# Patient Record
Sex: Male | Born: 1940 | Race: White | Hispanic: No | Smoking: Never smoker
Health system: Southern US, Community
[De-identification: ages and names within clinical notes are randomized; demographics above are authoritative.]

## PROBLEM LIST (undated history)

## (undated) DIAGNOSIS — I10 Essential (primary) hypertension: Secondary | ICD-10-CM

## (undated) DIAGNOSIS — K648 Other hemorrhoids: Secondary | ICD-10-CM

## (undated) DIAGNOSIS — E785 Hyperlipidemia, unspecified: Secondary | ICD-10-CM

## (undated) DIAGNOSIS — H9319 Tinnitus, unspecified ear: Secondary | ICD-10-CM

## (undated) DIAGNOSIS — J189 Pneumonia, unspecified organism: Secondary | ICD-10-CM

## (undated) DIAGNOSIS — M1990 Unspecified osteoarthritis, unspecified site: Secondary | ICD-10-CM

## (undated) HISTORY — DX: Other hemorrhoids: K64.8

## (undated) HISTORY — DX: Essential (primary) hypertension: I10

## (undated) HISTORY — DX: Hyperlipidemia, unspecified: E78.5

## (undated) HISTORY — PX: TONSILLECTOMY AND ADENOIDECTOMY: SUR1326

## (undated) HISTORY — DX: Pneumonia, unspecified organism: J18.9

---

## 2004-07-25 ENCOUNTER — Ambulatory Visit: Payer: Self-pay | Admitting: Internal Medicine

## 2005-05-08 HISTORY — PX: JOINT REPLACEMENT: SHX530

## 2005-05-24 ENCOUNTER — Encounter: Admission: RE | Admit: 2005-05-24 | Discharge: 2005-05-24 | Payer: Self-pay | Admitting: Specialist

## 2005-09-12 ENCOUNTER — Inpatient Hospital Stay (HOSPITAL_COMMUNITY): Admission: RE | Admit: 2005-09-12 | Discharge: 2005-09-15 | Payer: Self-pay | Admitting: Orthopedic Surgery

## 2008-05-08 DIAGNOSIS — J189 Pneumonia, unspecified organism: Secondary | ICD-10-CM

## 2008-05-08 HISTORY — DX: Pneumonia, unspecified organism: J18.9

## 2008-05-24 ENCOUNTER — Ambulatory Visit: Payer: Self-pay | Admitting: Family Medicine

## 2008-05-24 ENCOUNTER — Inpatient Hospital Stay (HOSPITAL_COMMUNITY): Admission: EM | Admit: 2008-05-24 | Discharge: 2008-05-28 | Payer: Self-pay | Admitting: Emergency Medicine

## 2010-08-22 LAB — CBC
Hemoglobin: 12.8 g/dL — ABNORMAL LOW (ref 13.0–17.0)
Platelets: 273 10*3/uL (ref 150–400)
RBC: 4.02 MIL/uL — ABNORMAL LOW (ref 4.22–5.81)
RBC: 4.1 MIL/uL — ABNORMAL LOW (ref 4.22–5.81)
WBC: 11.3 10*3/uL — ABNORMAL HIGH (ref 4.0–10.5)

## 2010-08-22 LAB — BASIC METABOLIC PANEL
BUN: 20 mg/dL (ref 6–23)
CO2: 23 mEq/L (ref 19–32)
CO2: 25 mEq/L (ref 19–32)
Chloride: 101 mEq/L (ref 96–112)
Chloride: 105 mEq/L (ref 96–112)
Creatinine, Ser: 1.09 mg/dL (ref 0.4–1.5)
Creatinine, Ser: 1.17 mg/dL (ref 0.4–1.5)
GFR calc Af Amer: >60 mL/min (ref >60)
GFR calc Af Amer: >60 mL/min (ref >60)
GFR calc non Af Amer: 54 mL/min — ABNORMAL LOW (ref >60)
GFR calc non Af Amer: >60 mL/min (ref >60)
Glucose, Bld: 115 mg/dL — ABNORMAL HIGH (ref 70–99)
Glucose, Bld: 135 mg/dL — ABNORMAL HIGH (ref 70–99)
Potassium: 3.7 mEq/L (ref 3.5–5.1)
Potassium: 4.1 mEq/L (ref 3.5–5.1)
Potassium: 5.2 mEq/L — ABNORMAL HIGH (ref 3.5–5.1)
Sodium: 133 mEq/L — ABNORMAL LOW (ref 135–145)
Sodium: 139 mEq/L (ref 135–145)

## 2010-08-22 LAB — COMPREHENSIVE METABOLIC PANEL
GFR calc non Af Amer: 43 mL/min — ABNORMAL LOW (ref >60)
Potassium: 4.1 mEq/L (ref 3.5–5.1)
Sodium: 132 mEq/L — ABNORMAL LOW (ref 135–145)
Total Bilirubin: 2.1 mg/dL — ABNORMAL HIGH (ref 0.3–1.2)
Total Protein: 6.8 g/dL (ref 6.0–8.3)

## 2010-08-22 LAB — LEGIONELLA ANTIGEN, URINE: Legionella Antigen, Urine: NEGATIVE

## 2010-09-20 NOTE — H&P (Signed)
NAME:  Steven Paul, Steven Paul NO.:  [id number]   MEDICAL RECORD NO.:  [id number]          Paul TYPE:  INP   LOCATION:  5029                         FACILITY:  [facility]   PHYSICIAN:  Leighton Roach McDiarmid, M.D.DATE OF BIRTH:  1940-06-05   DATE OF ADMISSION:  05/24/2008  DATE OF DISCHARGE:                              HISTORY & PHYSICAL   PRIMARY CARE PHYSICIAN:  Harrel Lemon. Merla Riches, MD, at [facility].   CHIEF COMPLAINT:  Shortness of breath, cough, and fever.   HISTORY OF PRESENT ILLNESS:  Steven Paul is a 70 year old male who  presented to [facility] this morning complaining of pleuritic  chest pain, shortness of breath, cough, and fever to 101.6 for 3-4 days.  Approximately 10 days ago, he was ill with an influenza-like illness,  complaining of symptoms of fever, nasal congestion, cough, sore throat,  and body aches.  This lasted approximately 3 to 4 days and then resolved  completely.  He was able to return to work for 1 week.  He became ill  again 3-4 days ago with a cough, right-sided pleuritic chest pain, and a  feeling that he cannot catch his breath and fever to 101.6.  He did a  course in over-Steven-counter without relief.  He reported to [facility] this morning and was found to have a right lower lobe pneumonia on  chest x-ray and had oxygen saturations of 89-90% on room air, and an  elevated white blood cell count 19.5 with a left shift and thus was sent  for admission to [facility].   PAST MEDICAL HISTORY:  1. Hypertension.  2. Hyperlipidemia.   PAST SURGICAL HISTORY:  Left hip replacement in 2007.   ALLERGIES:  No known drug allergies.   CURRENT MEDICATIONS:  1. Lisinopril/hydrochlorothiazide 20/12.5 p.o. daily.  2. Zocor 40 mg p.o. daily.  3. Aspirin 81 mg p.o. daily.   SOCIAL HISTORY:  Steven Paul lives alone.  He works as Steven Social research officer, government for [employer].  He denies any tobacco use in his  lifetime.  He has a beer approximately once a week and denies any other  drug use.  He is accompanied by his son to Steven emergency room.   FAMILY HISTORY:  Steven Paul denies any known family history of any  medical problems.   REVIEW OF SYSTEMS:  CONSTITUTIONAL:  Complains of fevers, chills,  sweats, decreased appetite, and fatigue.  HEENT:  Denies headaches, sore  throat, ear pain, or rhinorrhea.  CARDIOVASCULAR:  Does complain of  pleuritic chest pain as mentioned above.  Denies edema, orthopnea,  paroxysmal nocturnal dyspnea, and palpitations.  RESPIRATORY:  Complains  of cough, dyspnea as noted above.  Denies wheezing, sputum production,  or hemoptysis.  GI:  Denies nausea and vomiting, any blood in Steven stool,  any abdominal pain.  He does complain of several episodes of diarrhea.  GU:  Denies dysuria, hematuria, nocturia.  SKIN:  Denies rash.  MUSCULOSKELETAL:  Denies myalgias, arthralgias, or swelling.  NEURO:  Denies any visual changes, weakness, numbness, dizziness.  HEME:  Denies  any bleeding or easy bruising.  ENDOCRINE:  Denies polyuria, polydipsia,  heat, and cold intolerance.   PHYSICAL EXAMINATION:  VITAL SIGNS:  Temperature is 99.1, pulse is 77,  respiratory rate is 18, blood pressure 133/81, pulse ox is 95% on 2 L  nasal cannula.  GENERAL:  Steven Paul is pleasant, alert and oriented, in no acute  distress.  HEENT:  Pupils equally round and reactive to light and accommodation.  Extraocular muscles intact.  Sclerae are clear.  Oropharynx is clear  without erythema or exudate.  Steven Paul has moist mucous membranes.  No nasal congestion.  NECK:  Nontender and supple with no lymphadenopathy.  CARDIOVASCULAR:  Regular rate and rhythm.  No murmurs, rubs, or gallops.  LUNGS:  Steven Paul has a normal respiratory effort.  He does have a  pleuritic chest pain on Steven right.  Steven Paul does have crackles in  Steven right lower lobe up to Steven base of Steven right upper lobe.  Steven  left  lung is clear.  ABDOMEN:  Soft, nontender, nondistended.  Normoactive bowel sounds.  No  masses, no hepatosplenomegaly.  EXTREMITIES:  Nontender.  No edema.  A 2+ dorsalis pedis pulses  bilaterally.  NEURO:  Nonfocal.  MUSCULOSKELETAL:  No deformities, no joint swelling, or erythema are  noted.  SKIN:  No rashes or sores.   LABORATORY DATA AND STUDIES:  White blood count of 19.5, hemoglobin of  14.6, hematocrit of 42.9, platelets of 332, absolute neutrophil count of  16.8.  A chest x-ray showed a right lower lobe pneumonia.  These studies  were all performed at [facility] as well as blood cultures were  drawn at [facility].  He was also given 60 mg of Toradol at  [facility].   ASSESSMENT AND PLAN:  This is a 70 year old male with pneumonia  following an influenza-like illness.  1. Pneumonia.  We will treat with vancomycin to cover possible      methicillin-resistant Staphylococcus aureus infection which is more      common after influenza-like illness  and we will use Avelox for      gram negatives and atypicals.  We will continue oxygen for support      to keep O2 saturations greater than 92%.  We will use ibuprofen for      pleuritic chest pain and Percocet if this is not strong enough.  We      will follow up his blood cultures from [facility].  2. Hypertension.  Steven Paul is currently stable off of his      medications.  We will follow and restart as indicated.  We will      obtain a CMET to evaluate Steven Paul's renal function.  3. Hyperlipidemia.  We will continue Steven Paul's home Zocor.  We      will obtain a CMET to evaluate Steven Paul's liver function.  4. Prophylaxis.  We will use Lovenox for deep vein thrombosis      prophylaxis.  We will give Steven Paul Steven PPIs as he will be      taking scheduled ibuprofen.  5. Disposition.  Steven Paul will be stable to discharge home when he      is afebrile and off supplemental  oxygen.      Levander Campion, M.D.  Electronically Signed      Leighton Roach McDiarmid, M.D.  Electronically Signed    JH/MEDQ  D:  05/24/2008  T:  05/25/2008  Job:  [id number]

## 2010-09-20 NOTE — Discharge Summary (Signed)
NAME:  Steven Paul, Steven Paul NO.:  [id number]   MEDICAL RECORD NO.:  [id number]          PATIENT TYPE:  INP   LOCATION:  5029                         FACILITY:  [facility]   PHYSICIAN:  Leighton Roach McDiarmid, M.D.DATE OF BIRTH:  1940/06/25   DATE OF ADMISSION:  05/24/2008  DATE OF DISCHARGE:  05/28/2008                               DISCHARGE SUMMARY   PRIMARY CARE Icey Tello:  Harrel Lemon. Merla Riches, MD   DISCHARGE DIAGNOSES:  1. Post influenza pneumonia.  2. Hypertension.  3. Acute renal failure.  4. Hyperlipidemia.   DISCHARGE MEDICATIONS:  1. Vancomycin 1000 mg IV in normal saline at 250 mL an hour twice a      day.  Stop date June 03, 2008.  2. Avelox 400 mg by mouth daily.  Stop date June 07, 2008.  3. Lisinopril - hydrochlorothiazide 20/12.5 mg 1 tablet by mouth      daily.  4. Zocor 40 mg by mouth daily.  5. Tylenol 650 mg by mouth every 6 hours as needed for pain.  6. Ibuprofen 600 mg by mouth every 6 hours as needed for pain.   CONSULTS:  None.   PROCEDURES:  1. Chest x-ray on May 25, 2008:  Right pleural effusion, bibasilar      airspace disease.  Ovoid opacity projecting of the minor fissure      may represent loculated pleural fluid, however mass cannot be      excluded.  2. CT of the chest on May 25, 2008:  Dense right lower lobe      pneumonia and atelectasis.  Small right pleural effusion including      fluid in the posterior aspect of the minor fissure, corresponding      to the oval density seen on the chest radiographs.  3. Right PICC line placement.   LABORATORY DATAS:  1. CMET on May 24, 2008:  Sodium 132, glucose 155, BUN 34,      creatinine 1.63.  2. CBC on May 25, 2008:  WBC 17.7, hemoglobin 13.2, hematocrit      38.2, platelets 273.  3. Strep pneumo urinary antigen negative.  4. Urine Legionella antigen negative.  5. BNP:  252.  6. Hemoglobin A1c 6.0.  7. CBC on May 27, 2008:  WBCs 11.3, hemoglobin 12.8, hematocrit      38.2, platelets 324.  8. BMET on May 27, 2008:  Sodium 137, potassium 3.7, glucose 115,      BUN 16, creatinine 1.17.   BRIEF HOSPITAL COURSE:  The patient is a 70 year old male with history  of hypertension and hyperlipidemia who presents with a right lower lobe  pneumonia following an influenza-like illness.  1. Right lower lobe pneumonia:  The patient had an influenza-like      illness 10 days prior to pleuritic chest pain and an increased      oxygen requirement.  The patient was seen at [facility] for pleuritic      chest pain and had a pulse ox of 89% in the clinic.  The patient      was transferred to [facility] for further  evaluation.  The patient      had a chest x-ray and a CT scan of the chest for evaluation.  CT      scan showed a dense right lower lobe pneumonia but no signs of      empyema or mass.  The patient was treated for a post influenza      pneumonia with vancomycin and Avelox.  A PICC line was placed on      May 27, 2008 for home IV antibiotic treatment.  On admission,      the patient had a slight oxygen requirement of 2 L by nasal      cannula.  At the time of discharge, the patient was breathing      comfortably on room air, was not tachypneic and was able to      ambulate without becoming short of breath.  The patient was also      tolerating food and liquids without difficulty.  2. Acute renal failure:  The patient came in with a decreased p.o.      intake and had a creatinine of 1.63.  The patient was hydrated      adequately with IV fluids.  With this hydration, the patient's      creatinine appropriately trended down.  Most recent creatinine      before discharge was 1.09.  The patient was able to drink plenty of      fluids and was tolerating food without difficulty.  3. Hypertension.  The patient's blood pressure was acceptable      throughout his hospital stay and it was slightly elevated on the      time of discharge with a systolic blood  pressure between 135 and      160.  The patient's blood pressure medications were initially held      because of his acute renal failure.  The patient's lisinopril and      hydrochlorothiazide were restarted on May 26, 2008.  The      patient's blood pressure remained stable, however, recommend      consider increasing the patient's lisinopril to 40 mg p.o. daily as      an outpatient.  4. Hyperlipidemia.  The patient was continued on his Zocor home dose.   DISCHARGE INSTRUCTIONS:  The patient is to seek medical care for any  increased shortness of breath, difficulty breathing or inability to  tolerate anything by mouth.  The patient is to increase his activity  slowly.  The patient is to have a low-sodium heart-healthy diet.   FOLLOW UP:  The patient is to return to Dr. Merla Riches at [facility], phone number [phone] on June 03, 2008 at 1:45 p.m.   DISCHARGE CONDITION:  The patient was discharged home in stable and  improved condition.      Angelena Sole, MD  Electronically Signed      Leighton Roach McDiarmid, M.D.  Electronically Signed    WS/MEDQ  D:  05/28/2008  T:  05/29/2008  Job:  [id number]

## 2010-09-23 NOTE — H&P (Signed)
NAME:  Steven Paul NO.:  [id number]   MEDICAL RECORD NO.:  [id number]          PATIENT TYPE:  INP   LOCATION:  NA                           FACILITY:  [facility]   PHYSICIAN:  Steven Paul. Steven Paul, M.D.  DATE OF BIRTH:  [birth year]   DATE OF ADMISSION:  09/12/2005  DATE OF DISCHARGE:                                HISTORY & PHYSICAL   CHIEF COMPLAINT:  Pain in my left hip.   PRESENT ILLNESS:  This is a 70 year old white male seen by [place] for continuing  progressive problems concerning pain into his left hip.  He has been seen by  Dr. Elvera Lennox. Valma Cava of our practice with a history of left groin pain  which has now worsened to the point where he really is having difficulty  getting about, unable to perform his day-to-day activities.  He has had  intra-articular injection into the left hip which provided him with some  relief but, unfortunately, it did not last to the point where he was  relieved of his overall discomfort.  After much discussion, including the  risks and benefits of surgery and exhaustion of the conservative measures  which included nonsteroidal anti-inflammatories as well as hip injection, it  was decided to go ahead with total hip replacement arthroplasty.  Dr. Charlann Paul  has discussed the risks and benefits with the patient concerning that  procedure.  He has been cleared preoperatively by Dr. Nichola Sizer of the  [facility], his regular physician.   PAST MEDICAL HISTORY:  The patient has had no previous surgeries.  He is  being been treated now for dyslipidemia and hypertension.   CURRENT MEDICATIONS:  Of Lipitor daily and a new blood pressure medication  which will be available when the patient arrives at the hospital for this  surgical procedure.  Name currently is unknown.   ALLERGIES:  No medical allergies.   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   FAMILY HISTORY:   Noncontributory.   REVIEW OF SYSTEMS:  CNS:  No seizures or paralysis, numbness, double vision.  RESPIRATORY:  No productive cough, no hemoptysis or shortness of breath.  CARDIOVASCULAR:  No chest pain, no angina or orthopnea.  GASTROINTESTINAL:  No nausea, vomiting, melena, or bloody stool.  GENITOURINARY:  No discharge,  dysuria, or hematuria.  MUSCULOSKELETAL:  Primarily in present illness.   PHYSICAL EXAMINATION:  GENERAL:  Alert and cooperative, friendly 70 year old  white male who walks with an obvious limp and a lurch the left.  VITAL SIGNS:  Blood pressure 160/88, pulse 78 and regular, respirations 12  unlabored.  HEENT:  Normocephalic, PERRLA, EOM intact.  Oropharynx is clear.  CHEST:  Clear to auscultation.  No rhonchi or rales.  HEART:  Regular rate and rhythm.  No murmurs are heard.  ABDOMEN:  Soft, nontender, liver and spleen not felt.  GENITALIA/RECTAL:  Not done, not pertinent to present illness.  EXTREMITIES:  The patient has no neuro deficits in the lower extremities.  He has limited range of motion of the  left hip with discomfort on the end  parameters.   ADMISSION DIAGNOSES:  1.  Left hip osteoarthritis.  2.  Hypertension.  3.  Dyslipidemia.   PLAN:  The patient will be admitted for total hip replacement arthroplasty.  In all probability, he will be able to go home with home health.  He has a  walker, and I told him to bring this to the hospital so that he can have  this fitted to his body frame.  He has family support, and I recommend that  a family member stay with him overnight as well as during the day for 3-4  days after his return home until he is confident and safe with ambulation.      Dooley L. Cherlynn June.      Steven Paul Steven Paul, M.D.  Electronically Signed    DLU/MEDQ  D:  09/07/2005  T:  09/07/2005  Job:  [id number]   cc:   Harrel Lemon. Merla Riches, M.D.  Fax: [phone]

## 2010-09-23 NOTE — Op Note (Signed)
NAME:  Steven Paul, Steven Paul NO.:  [id number]   MEDICAL RECORD NO.:  [id number]          PATIENT TYPE:  INP   LOCATION:  0001                         FACILITY:  [facility]   PHYSICIAN:  Madlyn Frankel. Charlann Boxer, M.D.  DATE OF BIRTH:  10/03/1940   DATE OF PROCEDURE:  09/12/2005  DATE OF DISCHARGE:                                 OPERATIVE REPORT   PREOPERATIVE DIAGNOSIS:  Left hip osteoarthritis.   POSTOPERATIVE DIAGNOSIS:  Left hip osteoarthritis.   PROCEDURE:  Left total hip replacement.   COMPONENTS USED:  DePuy hip system with a size 54 ASR cup, Tri-Lock 11.3  lateralized offset stem with a 47 ASR ball with a +2 adapter.   SURGEON:  Madlyn Frankel. Charlann Boxer, M.D.   ASSISTANT:  Cherly Beach, P.A.   ANESTHESIA:  General.   BLOOD LOSS:  300 mL.   DRAINS:  Drains x1.   COMPLICATIONS:  None.   INDICATION FOR PROCEDURE:  Mr. Steven Paul is a 70 year old gentleman who  presented to the office for evaluation of his left hip.  He was kindly sent  over for evaluation from Dr. Thomasena Edis.  Cortisone injection had been  performed; he had had no significant longstanding relief.  Given the failure  to respond to conservative measures, he wished to proceed with hip  replacement surgery.  Risks and benefits were discussed including bearing  surfaces with hip dislocation, DVT, infection, primary concern and other  concerns in terms of persistent discomfort postoperatively, need for  revision later, as well as the bearing surface discussion.  At this point,  we discussed bearing in terms of metal-on-metal, ceramic-on-ceramic versus  the polyethylenes.  He chose the hard-bearing surface.  He furthermore was  interested in the availability of the larger ball size which decreases wear  in the labs as well as increases the jump, reducing the risk of dislocation.  All this was reviewed and he chose to have an ASR hip technology, provided  his bone would tolerate it.  Consent was obtained.   PROCEDURE  IN DETAIL:  The patient was brought to the operative theatre.  Once adequate anesthesia and preoperative antibiotics, 1 g of Ancef, were  administered, the patient was positioned in the right lateral decubitus  position with the left side up.  The left lower extremity was then prepped  and draped in a sterile fashion.  A lateral-based incision was made for  posterior approach to the hip.  Short external rotators were taken down  separate from the posterior capsule, which was saved as a leaflet to repair  against the superior leaflet.   The hip was dislocated.  At this point, a neck osteotomy was made, based  with using the templated guide, based on the center of the femoral head.  The patient's preoperative leg lengths were equal and our idea was to  maintain his current position.  Neck osteotomy was made based on his  anatomic landmarks and his preoperative templating.  Following this,  attention was directed to the femoral preparation.  Femur exposure was  obtained, starting reamer passed by hand followed by broaching.  Broaching  was carried up all the way up to an 11.3 with excellent purchase.  For this  reason, attention was now directed to the acetabulum.  Please note that I  had about 20-25 degrees of anteversion in the femoral neck with this broach  in place.   Acetabular exposure was obtained routinely and it was note to have a very  hypertrophic, enlarged labrum, which was excised.  Following acetabular  exposure, reaming commenced with a 47 reamer, medialized and then  sequentially reamed up using mini curved reamers to a 53 reamer.  This had  excellent bone preparation with good bony coverage.  The final 54 ASL cup  was then impacted with a curved reamer to allow for correct orientation in  terms of forward flexion.  This was impacted to a good style of fit at the  level where the reamers were positioned.   The cup was positioned at 35-40 degrees of abduction and 20 degrees  of  forward flexion.  It was beneath the anterior ream anteriorly and level with  the ischium posteriorly.   Following this, attention was redirected the femur, where the trial broach  was positioned and trial reduction was carried out.  I was very happy with  the leg lengths, as I had compared the operative leg to the down leg  preoperatively based on his supine position and radiographic appearance.   His leg lengths appeared to be very equal and the hip was extremely stable  with no signs of subluxation.  Given this, the final components were brought  to the field, holding all but the sleeve for the ASL ball.  The final 11.3  lateralized offset stem was then impacted to the level of the neck cut.  With this, a trial reduction was again carried out and I was happy with this  location with stability and leg lengths.  The final +2 adapter was impacted  into the 47 ball and this complex then impacted onto a clean and dry  trunnion.  The hip was reduced.  The hip was irrigated throughout the case.  There were no obvious complications.  The medium Hemovac drain was placed  deep.  The posterior capsular leaflet was reapproximated to the superior  leaflet using #1 Ethibond.  The remainder of the iliotibial band was  reapproximated using a #1 Ethibond and the gluteal fascia was reapproximated  with a #1 Vicryl.  The wound was closed in layers with a 4-0 Monocryl.   The patient was brought to recovery room with a sterile dry dressing and  Steri-Strips.  The patient tolerated the procedure well without  complications.      Madlyn Frankel Charlann Boxer, M.D.  Electronically Signed     MDO/MEDQ  D:  09/12/2005  T:  09/13/2005  Job:  [id number]

## 2010-09-23 NOTE — Discharge Summary (Signed)
NAME:  Steven Paul, VIZZINI NO.:  [id number]   MEDICAL RECORD NO.:  [id number]          PATIENT TYPE:  INP   LOCATION:  1516                         FACILITY:  [facility]   PHYSICIAN:  Madlyn Frankel. Charlann Boxer, M.D.  DATE OF BIRTH:  1940-05-18   DATE OF ADMISSION:  09/12/2005  DATE OF DISCHARGE:  09/15/2005                                 DISCHARGE SUMMARY   ADMITTING DIAGNOSES:  1.  Left hip osteoarthritis.  2.  Hypertension.  3.  Dyslipidemia.   DISCHARGE DIAGNOSES:  1.  Left hip osteoarthritis.  2.  Hypertension.  3.  Dyslipidemia.  4.  Mild postoperative anemia.   OPERATION:  On Sep 12, 2005, the patient underwent left total hip replacement  arthroplasty utilizing DePuy hip system.  [facility] assisted.   BRIEF HISTORY:  This 70 year old gentleman with progressive problems  concerning pain into his left hip.  He works at [employer] and is on his feet a considerable amount of time.  He now finds  that his day-to-day activity is markedly being interfered with his  progression of pain into the left hip.  He has had intraarticular cortisone  injections of the hip which have provided him with minimal to no relief.  After much discussion including the risks and benefits of surgery, it was  decided to go ahead with total hip replacement arthroplasty and the patient  was admitted for same.   COURSE IN THE HOSPITAL:  The patient tolerated the surgical procedure quite  well.  He entered eagerly into the rehabilitation program on the hospital  floor.  The wound remained dry, Hemovac was discontinued with no problem,  the patient was able to void and positive flatus, and was ambulating in the  hall prior to discharge.   Once the home equipment was delivered as well as home health arranged, it  was felt the patient could be maintained in his home environment and he was  discharged home.   On the day of discharge his left hip wound was clean and dry,  neurovascular  was intact to the left lower extremity.  He did run a mild low-grade  temperature while in the hospital but incentive spirometer was used and he  was normal at discharge.  Laboratory values in the hospital hematologically  showed a CBC with differential preoperatively completely within normal  limits.  Hemoglobin was 14.1 with hematocrit of 41.4.  Final hemoglobin was  10.6 with hematocrit of 31.1.  Blood chemistries were normal and urinalysis  negative for urinary tract infection.  Chest x-ray showed low inspiratory  lung volumes with no active disease.  Electrocardiogram was normal sinus  rhythm, moderate voltage criteria for LVH, may be normal variant.   CONDITION ON DISCHARGE:  Improved, stable.   PLAN:  The patient is discharged to his home with home health.  Return to  see Dr. Charlann Boxer 2 weeks after the date of surgery.  He is to continue with 50%  weightbearing for at least 6 weeks after the date of surgery.  We will  continue  Coumadin protocol for 4  weeks after the date of surgery to be adjusted by  home health pharmacy.  Continue with home medications and diet, avoid green  vegetables.  Call with any problems.  He is to follow up with his medical  doctor per his instructions.      Dooley L. Cherlynn June.      Madlyn Frankel Charlann Boxer, M.D.  Electronically Signed    DLU/MEDQ  D:  09/15/2005  T:  09/16/2005  Job:  [id number]   cc:   Harrel Lemon. Merla Riches, M.D.  Fax: [phone]

## 2011-08-02 ENCOUNTER — Ambulatory Visit (INDEPENDENT_AMBULATORY_CARE_PROVIDER_SITE_OTHER): Payer: PRIVATE HEALTH INSURANCE | Admitting: Internal Medicine

## 2011-08-02 ENCOUNTER — Encounter: Payer: Self-pay | Admitting: Internal Medicine

## 2011-08-02 VITALS — BP 147/96 | HR 67 | Temp 97.0°F | Resp 16 | Ht 66.0 in | Wt 192.6 lb

## 2011-08-02 DIAGNOSIS — R739 Hyperglycemia, unspecified: Secondary | ICD-10-CM

## 2011-08-02 DIAGNOSIS — I10 Essential (primary) hypertension: Secondary | ICD-10-CM | POA: Insufficient documentation

## 2011-08-02 DIAGNOSIS — R7309 Other abnormal glucose: Secondary | ICD-10-CM

## 2011-08-02 DIAGNOSIS — Z0000 Encounter for general adult medical examination without abnormal findings: Secondary | ICD-10-CM

## 2011-08-02 DIAGNOSIS — E785 Hyperlipidemia, unspecified: Secondary | ICD-10-CM | POA: Insufficient documentation

## 2011-08-02 LAB — POCT URINALYSIS DIPSTICK
Glucose, UA: NEGATIVE
Nitrite, UA: NEGATIVE
pH, UA: 5.5

## 2011-08-02 LAB — CBC WITH DIFFERENTIAL/PLATELET
Basophils Absolute: 0 10*3/uL (ref 0.0–0.1)
HCT: 44.5 % (ref 39.0–52.0)
Lymphs Abs: 2.1 10*3/uL (ref 0.7–4.0)
MCHC: 33.7 g/dL (ref 30.0–36.0)
Monocytes Absolute: 1.6 10*3/uL — ABNORMAL HIGH (ref 0.1–1.0)
Neutrophils Relative %: 61 % (ref 43–77)
RBC: 4.8 MIL/uL (ref 4.22–5.81)
RDW: 14.1 % (ref 11.5–15.5)

## 2011-08-02 LAB — LIPID PANEL
Cholesterol: 215 mg/dL — ABNORMAL HIGH (ref 0–200)
LDL Cholesterol: 126 mg/dL — ABNORMAL HIGH (ref 0–99)
VLDL: 45 mg/dL — ABNORMAL HIGH (ref 0–40)

## 2011-08-02 LAB — COMPREHENSIVE METABOLIC PANEL
AST: 18 U/L (ref 0–37)
Calcium: 10 mg/dL (ref 8.4–10.5)
Glucose, Bld: 105 mg/dL — ABNORMAL HIGH (ref 70–99)
Potassium: 4.4 mEq/L (ref 3.5–5.3)
Total Protein: 6.9 g/dL (ref 6.0–8.3)

## 2011-08-02 LAB — PSA: PSA: 0.51 ng/mL (ref <=4.00)

## 2011-08-02 MED ORDER — LISINOPRIL-HYDROCHLOROTHIAZIDE 20-12.5 MG PO TABS
1.0000 | ORAL_TABLET | Freq: Every day | ORAL | Status: DC
Start: 2011-08-02 — End: 2012-01-25

## 2011-08-02 MED ORDER — SIMVASTATIN 20 MG PO TABS: 20.0000 mg | ORAL_TABLET | Freq: Every evening | ORAL | Status: DC

## 2011-08-02 NOTE — Progress Notes (Signed)
  Subjective:    Patient ID: Steven Paul, male    DOB: May 25, 1940, 71 y.o.   MRN: [id number]  HPI Patient Active Problem List  Diagnoses  . HTN (hypertension)  . Hyperlipemia  . 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   HENT: Negative.   Eyes: Negative.   Respiratory: Negative.   Cardiovascular: Negative.   Gastrointestinal: Negative.   Genitourinary: Negative.   Musculoskeletal: Negative.   Skin: Negative.   Neurological: Negative.   Hematological: Negative.   Psychiatric/Behavioral: Negative.        Objective:   Physical Exam  Constitutional: He is oriented to person, place, and time. He appears well-developed and well-nourished.  HENT:  Head: Normocephalic.  Nose: Nose normal.  Mouth/Throat: Oropharynx is clear and moist.       TMs and canals clear  Eyes: Conjunctivae and EOM are normal. Pupils are equal, round, and reactive to light.  Neck: Normal range of motion. Neck supple. No thyromegaly present.  Cardiovascular: Normal rate, regular rhythm, normal heart sounds and intact distal pulses.        No carotid bruits  Pulmonary/Chest: Effort normal and breath sounds normal.  Abdominal: Soft. Bowel sounds are normal. He exhibits no mass.       No organomegaly  Genitourinary:       Recent colonoscopy/last prostate exam within normal limits with PSA less than 1  Musculoskeletal: Normal range of motion. He exhibits no tenderness.  Neurological: He is alert and oriented to person, place, and time. He has normal reflexes.  Skin: No rash noted.  Psychiatric: He has a normal mood and affect. His behavior is normal. Judgment and thought content normal.          Assessment & Plan:  Problem #1 annual physical exam Zostavax PSA  #2 hypertension Meds refilled  Problem #3  hyperlipidemia Meds refilled/labs checked  Problem #4 hemoglobin A1c 6.1 Repeated Urged weight loss 20 pounds/discussed increase exercise and decrease calories  Followup 6 months

## 2011-08-03 LAB — HEMOGLOBIN A1C: Mean Plasma Glucose: 131 mg/dL — ABNORMAL HIGH (ref <117)

## 2011-08-08 ENCOUNTER — Encounter: Payer: Self-pay | Admitting: Internal Medicine

## 2011-08-11 ENCOUNTER — Other Ambulatory Visit: Payer: Self-pay | Admitting: Internal Medicine

## 2011-11-23 ENCOUNTER — Encounter: Payer: Self-pay | Admitting: Family Medicine

## 2012-01-01 ENCOUNTER — Encounter: Payer: Self-pay | Admitting: Internal Medicine

## 2012-01-25 ENCOUNTER — Encounter (HOSPITAL_COMMUNITY): Payer: Self-pay | Admitting: Pharmacy Technician

## 2012-01-31 ENCOUNTER — Ambulatory Visit (HOSPITAL_COMMUNITY)
Admission: RE | Admit: 2012-01-31 | Discharge: 2012-01-31 | Disposition: A | Discharge status: Home / Self Care | Payer: PRIVATE HEALTH INSURANCE | Source: Ambulatory Visit | Attending: Orthopedic Surgery | Admitting: Orthopedic Surgery

## 2012-01-31 ENCOUNTER — Ambulatory Visit: Payer: PRIVATE HEALTH INSURANCE | Admitting: Internal Medicine

## 2012-01-31 ENCOUNTER — Encounter (HOSPITAL_COMMUNITY)
Admission: RE | Admit: 2012-01-31 | Discharge: 2012-01-31 | Disposition: A | Discharge status: Home / Self Care | Payer: PRIVATE HEALTH INSURANCE | Source: Ambulatory Visit | Attending: Orthopedic Surgery | Admitting: Orthopedic Surgery

## 2012-01-31 ENCOUNTER — Encounter (HOSPITAL_COMMUNITY): Payer: Self-pay

## 2012-01-31 DIAGNOSIS — I517 Cardiomegaly: Secondary | ICD-10-CM | POA: Insufficient documentation

## 2012-01-31 DIAGNOSIS — Z01810 Encounter for preprocedural cardiovascular examination: Secondary | ICD-10-CM | POA: Insufficient documentation

## 2012-01-31 DIAGNOSIS — Z01812 Encounter for preprocedural laboratory examination: Secondary | ICD-10-CM | POA: Insufficient documentation

## 2012-01-31 DIAGNOSIS — Z01818 Encounter for other preprocedural examination: Secondary | ICD-10-CM | POA: Insufficient documentation

## 2012-01-31 HISTORY — DX: Tinnitus, unspecified ear: H93.19

## 2012-01-31 LAB — CBC
MCH: 31.4 pg (ref 26.0–34.0)
Platelets: 257 10*3/uL (ref 150–400)
RBC: 4.77 MIL/uL (ref 4.22–5.81)
RDW: 13.7 % (ref 11.5–15.5)
WBC: 7.4 10*3/uL (ref 4.0–10.5)

## 2012-01-31 LAB — URINALYSIS, ROUTINE W REFLEX MICROSCOPIC
Glucose, UA: NEGATIVE mg/dL
Leukocytes, UA: NEGATIVE
Specific Gravity, Urine: 1.012 (ref 1.005–1.030)
pH: 5.5 (ref 5.0–8.0)

## 2012-01-31 LAB — BASIC METABOLIC PANEL
Calcium: 10.3 mg/dL (ref 8.4–10.5)
Creatinine, Ser: 1.23 mg/dL (ref 0.50–1.35)
GFR calc non Af Amer: 58 mL/min — ABNORMAL LOW (ref >90)
Sodium: 139 mEq/L (ref 135–145)

## 2012-01-31 LAB — SURGICAL PCR SCREEN
MRSA, PCR: NEGATIVE
Staphylococcus aureus: NEGATIVE

## 2012-01-31 LAB — APTT: aPTT: 34 seconds (ref 24–37)

## 2012-01-31 LAB — PROTIME-INR: Prothrombin Time: 12.9 seconds (ref 11.6–15.2)

## 2012-01-31 NOTE — Patient Instructions (Addendum)
20 EEAN BUSS  01/31/2012   Your procedure is scheduled on:  02-06-2012  Report to [facility] at 1130  AM.  Call this number if you have problems the morning of surgery: [phone]   Remember:   Do not eat food:After Midnight.   clear liquids midnight until 0800 am day of surgery, then nothing by mouth.  Take these medicines the morning of surgery with A SIP OF WATER: no meds to take   Do not wear jewelry or make up.  Do not wear lotions, powders, or perfumes. You may wear deodorant.    Do not bring valuables to the hospital.  Contacts, dentures or bridgework may not be worn into surgery.  Leave suitcase in the car. After surgery it may be brought to your room.  For patients admitted to the hospital, checkout time is 11:00 AM the day of discharge                             Patients discharged the day of surgery will not be allowed to drive home. If going home same day of surgery, you must have someone stay with you the first 24 hours at home and arrange for some one to drive you home from hospital.    Special Instructions: See [facility] Preparing for Surgery instruction sheet. Women do not shave legs or underarms for 12 hours before showers. Men may shave face morning of surgery.    Please read over the following fact sheets that you were given: MRSA Information, blood fact sheet, incentive spirometer fact sheet, clear liquids sheet  Cain S[facility] pre op nurse phone number [phone], call if needed

## 2012-01-31 NOTE — Progress Notes (Signed)
Medical clearance note dr Merla Riches on chart

## 2012-02-01 NOTE — H&P (Signed)
Steven Paul is an 71 y.o. male.    Chief Complaint:   Right hip OA and pain   HPI: Pt is a 71 y.o. male complaining of significant right hip pain for 6-7 months. Pain had continually increased since the beginning. X-rays in the clinic show end-stage arthritic changes of the right hip. Pt has tried various conservative treatments which have failed to alleviate their symptoms. Various options are discussed with the patient. Risks, benefits and expectations were discussed with the patient. Patient understand the risks, benefits and expectations and wishes to proceed with surgery.   PCP:  Tonye Pearson, MD  D/C Plans:  Home with HHPT  Post-op Meds:   Rx given for ASA, Robaxin, Iron, Colace and MiraLax  Tranexamic Acid:   To be given  Decadron:   To be given  PMH: Past Medical History  Diagnosis Date  . Hypertension   . Hyperlipidemia   . Internal hemorrhoids   . Pneumonia 05/2008  . Tinnitus     both ears all the time    PSH: Past Surgical History  Procedure Date  . Tonsillectomy and adenoidectomy as child  . Joint replacement 2007    L Hip- Dupay    Social History:  reports that he has never smoked. He has never used smokeless tobacco. He reports that he does not drink alcohol or use illicit drugs.  Allergies:  No Known Allergies  Medications: No current facility-administered medications for this encounter.   Current Outpatient Prescriptions  Medication Sig Dispense Refill  . lisinopril-hydrochlorothiazide (PRINZIDE,ZESTORETIC) 20-12.5 MG per tablet Take 1 tablet by mouth daily with breakfast.      . simvastatin (ZOCOR) 20 MG tablet Take 20 mg by mouth every evening.        Results for orders placed during the hospital encounter of 01/31/12 (from the past 48 hour(s))  URINALYSIS, ROUTINE W REFLEX MICROSCOPIC     Status: Normal   Collection Time   01/31/12  1:15 PM      Component Value Range Comment   Color, Urine YELLOW  YELLOW    APPearance CLEAR   CLEAR    Specific Gravity, Urine 1.012  1.005 - 1.030    pH 5.5  5.0 - 8.0    Glucose, UA NEGATIVE  NEGATIVE mg/dL    Hgb urine dipstick NEGATIVE  NEGATIVE    Bilirubin Urine NEGATIVE  NEGATIVE    Ketones, ur NEGATIVE  NEGATIVE mg/dL    Protein, ur NEGATIVE  NEGATIVE mg/dL    Urobilinogen, UA 0.2  0.0 - 1.0 mg/dL    Nitrite NEGATIVE  NEGATIVE    Leukocytes, UA NEGATIVE  NEGATIVE   SURGICAL PCR SCREEN     Status: Normal   Collection Time   01/31/12  1:15 PM      Component Value Range Comment   MRSA, PCR NEGATIVE  NEGATIVE    Staphylococcus aureus NEGATIVE  NEGATIVE   APTT     Status: Normal   Collection Time   01/31/12  2:00 PM      Component Value Range Comment   aPTT 34  24 - 37 seconds   BASIC METABOLIC PANEL     Status: Abnormal   Collection Time   01/31/12  2:00 PM      Component Value Range Comment   Sodium 139  135 - 145 mEq/L    Potassium 4.3  3.5 - 5.1 mEq/L    Chloride 102  96 - 112 mEq/L  CO2 26  19 - 32 mEq/L    Glucose, Bld 116 (*) 70 - 99 mg/dL    BUN 19  6 - 23 mg/dL    Creatinine, Ser 1.61  0.50 - 1.35 mg/dL    Calcium 09.6  8.4 - 10.5 mg/dL    GFR calc non Af Amer 58 (*) >90 mL/min    GFR calc Af Amer 67 (*) >90 mL/min   CBC     Status: Normal   Collection Time   01/31/12  2:00 PM      Component Value Range Comment   WBC 7.4  4.0 - 10.5 K/uL    RBC 4.77  4.22 - 5.81 MIL/uL    Hemoglobin 15.0  13.0 - 17.0 g/dL    HCT 04.5  40.9 - 81.1 %    MCV 89.3  78.0 - 100.0 fL    MCH 31.4  26.0 - 34.0 pg    MCHC 35.2  30.0 - 36.0 g/dL    RDW 91.4  78.2 - 95.6 %    Platelets 257  150 - 400 K/uL   PROTIME-INR     Status: Normal   Collection Time   01/31/12  2:00 PM      Component Value Range Comment   Prothrombin Time 12.9  11.6 - 15.2 seconds    INR 0.98  0.00 - 1.49    Dg Chest 2 View  01/31/2012  *RADIOLOGY REPORT*  Clinical Data: Preop radiograph  CHEST - 2 VIEW  Comparison: 05/27/2008  Findings: Tortuous and unfolded thoracic aorta is noted.  There is mild  cardiac enlargement.  No pleural effusion or edema.  No airspace consolidation.  IMPRESSION:  1.  No acute cardiopulmonary abnormalities. 2.  Mild cardiac enlargement.   Original Report Authenticated By: Rosealee Albee, M.D.     ROS: Review of Systems  Constitutional: Negative.   HENT: Negative.   Eyes: Negative.   Respiratory: Negative.   Cardiovascular: Negative.   Gastrointestinal: Negative.   Genitourinary: Negative.   Musculoskeletal: Positive for joint pain.  Skin: Negative.   Neurological: Negative.   Endo/Heme/Allergies: Negative.   Psychiatric/Behavioral: Negative.      Physical Exam: BP:   137/83  ;  HR:   84  ; Resp:   16  ; Physical Exam  Constitutional: He is oriented to person, place, and time and well-developed, well-nourished, and in no distress.  HENT:  Head: Normocephalic and atraumatic.  Nose: Nose normal.  Mouth/Throat: Oropharynx is clear and moist.  Eyes: Pupils are equal, round, and reactive to light.  Neck: Neck supple. No JVD present. No tracheal deviation present. No thyromegaly present.  Cardiovascular: Normal rate, regular rhythm, normal heart sounds and intact distal pulses.   Pulmonary/Chest: Effort normal and breath sounds normal. No stridor. No respiratory distress. He has no wheezes.  Abdominal: Soft. There is no tenderness. There is no guarding.  Musculoskeletal:       Right hip: He exhibits decreased range of motion, decreased strength, tenderness and bony tenderness. He exhibits no swelling, no deformity and no laceration.  Lymphadenopathy:    He has no cervical adenopathy.  Neurological: He is alert and oriented to person, place, and time.  Skin: Skin is warm and dry.  Psychiatric: Affect normal.     Assessment/Plan Assessment:   Right hip OA and pain   Plan: Patient will undergo a right total hip arthroplasty, anterior approach on 02/06/2012 per Dr. Charlann Boxer at [facility]. Risks benefits and expectations  were discussed with  the patient. Patient understand risks, benefits and expectations and wishes to proceed.   Anastasio Auerbach Kameshia Madruga   PAC  02/01/2012, 5:50 PM

## 2012-02-05 NOTE — Progress Notes (Signed)
Pt aware surgery time changed to 1335, arrive 1100 am 02-06-2012 [facility], cleat liquids midnight until 0730am, then nothing by mouth

## 2012-02-06 ENCOUNTER — Inpatient Hospital Stay (HOSPITAL_COMMUNITY): Payer: PRIVATE HEALTH INSURANCE

## 2012-02-06 ENCOUNTER — Inpatient Hospital Stay (HOSPITAL_COMMUNITY): Payer: PRIVATE HEALTH INSURANCE | Admitting: Anesthesiology

## 2012-02-06 ENCOUNTER — Encounter (HOSPITAL_COMMUNITY): Payer: Self-pay | Admitting: *Deleted

## 2012-02-06 ENCOUNTER — Encounter (HOSPITAL_COMMUNITY): Payer: Self-pay | Admitting: Anesthesiology

## 2012-02-06 ENCOUNTER — Encounter (HOSPITAL_COMMUNITY)
Admission: RE | Disposition: A | Discharge status: Home Health | Payer: Self-pay | Source: Ambulatory Visit | Attending: Orthopedic Surgery

## 2012-02-06 ENCOUNTER — Inpatient Hospital Stay (HOSPITAL_COMMUNITY)
Admission: RE | Admit: 2012-02-06 | Discharge: 2012-02-07 | DRG: 470 | Disposition: A | Discharge status: Home Health | Payer: PRIVATE HEALTH INSURANCE | Source: Ambulatory Visit | Attending: Orthopedic Surgery | Admitting: Orthopedic Surgery

## 2012-02-06 DIAGNOSIS — Z8701 Personal history of pneumonia (recurrent): Secondary | ICD-10-CM

## 2012-02-06 DIAGNOSIS — Z23 Encounter for immunization: Secondary | ICD-10-CM

## 2012-02-06 DIAGNOSIS — D62 Acute posthemorrhagic anemia: Secondary | ICD-10-CM | POA: Diagnosis not present

## 2012-02-06 DIAGNOSIS — Z6831 Body mass index (BMI) 31.0-31.9, adult: Secondary | ICD-10-CM

## 2012-02-06 DIAGNOSIS — Z79899 Other long term (current) drug therapy: Secondary | ICD-10-CM

## 2012-02-06 DIAGNOSIS — M169 Osteoarthritis of hip, unspecified: Principal | ICD-10-CM | POA: Diagnosis present

## 2012-02-06 DIAGNOSIS — D500 Iron deficiency anemia secondary to blood loss (chronic): Secondary | ICD-10-CM

## 2012-02-06 DIAGNOSIS — Z96649 Presence of unspecified artificial hip joint: Secondary | ICD-10-CM

## 2012-02-06 DIAGNOSIS — I10 Essential (primary) hypertension: Secondary | ICD-10-CM | POA: Diagnosis present

## 2012-02-06 DIAGNOSIS — E663 Overweight: Secondary | ICD-10-CM | POA: Diagnosis present

## 2012-02-06 DIAGNOSIS — E785 Hyperlipidemia, unspecified: Secondary | ICD-10-CM | POA: Diagnosis present

## 2012-02-06 DIAGNOSIS — M1610 Unilateral primary osteoarthritis, unspecified hip: Principal | ICD-10-CM | POA: Diagnosis present

## 2012-02-06 DIAGNOSIS — Z8719 Personal history of other diseases of the digestive system: Secondary | ICD-10-CM

## 2012-02-06 DIAGNOSIS — Z9089 Acquired absence of other organs: Secondary | ICD-10-CM

## 2012-02-06 DIAGNOSIS — E871 Hypo-osmolality and hyponatremia: Secondary | ICD-10-CM

## 2012-02-06 DIAGNOSIS — Z6829 Body mass index (BMI) 29.0-29.9, adult: Secondary | ICD-10-CM

## 2012-02-06 HISTORY — PX: TOTAL HIP ARTHROPLASTY: SHX124

## 2012-02-06 LAB — TYPE AND SCREEN
ABO/RH(D): O POS
Antibody Screen: NEGATIVE

## 2012-02-06 SURGERY — ARTHROPLASTY, HIP, TOTAL, ANTERIOR APPROACH
Anesthesia: General | Site: Hip | Laterality: Right | Wound class: Clean

## 2012-02-06 MED ORDER — ZOLPIDEM TARTRATE 5 MG PO TABS: 5.0000 mg | ORAL_TABLET | Freq: Every evening | ORAL | Status: DC | PRN

## 2012-02-06 MED ORDER — SUCCINYLCHOLINE CHLORIDE 20 MG/ML IJ SOLN
INTRAMUSCULAR | Status: DC | PRN
  Administered 2012-02-06: 100 mg via INTRAVENOUS

## 2012-02-06 MED ORDER — ONDANSETRON HCL 4 MG PO TABS: 4.0000 mg | ORAL_TABLET | Freq: Four times a day (QID) | ORAL | Status: DC | PRN

## 2012-02-06 MED ORDER — ROCURONIUM BROMIDE 100 MG/10ML IV SOLN
INTRAVENOUS | Status: DC | PRN
  Administered 2012-02-06: 50 mg via INTRAVENOUS

## 2012-02-06 MED ORDER — LIDOCAINE HCL (CARDIAC) 20 MG/ML IV SOLN
INTRAVENOUS | Status: DC | PRN
  Administered 2012-02-06: 50 mg via INTRAVENOUS

## 2012-02-06 MED ORDER — HYDROMORPHONE HCL PF 1 MG/ML IJ SOLN: 0.5000 mg | INTRAMUSCULAR | Status: DC | PRN

## 2012-02-06 MED ORDER — MIDAZOLAM HCL 5 MG/5ML IJ SOLN
INTRAMUSCULAR | Status: DC | PRN
  Administered 2012-02-06: 2 mg via INTRAVENOUS

## 2012-02-06 MED ORDER — FERROUS SULFATE 325 (65 FE) MG PO TABS
325.0000 mg | ORAL_TABLET | Freq: Three times a day (TID) | ORAL | Status: DC
  Administered 2012-02-06 – 2012-02-07 (×3): 325 mg via ORAL
  Filled 2012-02-06 (×5): qty 1

## 2012-02-06 MED ORDER — FENTANYL CITRATE 0.05 MG/ML IJ SOLN
INTRAMUSCULAR | Status: DC | PRN
  Administered 2012-02-06: 100 ug via INTRAVENOUS
  Administered 2012-02-06 (×3): 50 ug via INTRAVENOUS

## 2012-02-06 MED ORDER — EPHEDRINE SULFATE 50 MG/ML IJ SOLN
INTRAMUSCULAR | Status: DC | PRN
  Administered 2012-02-06: 5 mg via INTRAVENOUS

## 2012-02-06 MED ORDER — HYDROCODONE-ACETAMINOPHEN 7.5-325 MG PO TABS
1.0000 | ORAL_TABLET | ORAL | Status: DC
  Administered 2012-02-06 – 2012-02-07 (×6): 1 via ORAL
  Filled 2012-02-06 (×6): qty 1

## 2012-02-06 MED ORDER — HYDROMORPHONE HCL PF 1 MG/ML IJ SOLN: 0.2500 mg | INTRAMUSCULAR | Status: DC | PRN

## 2012-02-06 MED ORDER — HYDROCHLOROTHIAZIDE 25 MG PO TABS
12.5000 mg | ORAL_TABLET | Freq: Every day | ORAL | Status: DC
  Filled 2012-02-06: qty 0.5

## 2012-02-06 MED ORDER — NEOSTIGMINE METHYLSULFATE 1 MG/ML IJ SOLN
INTRAMUSCULAR | Status: DC | PRN
  Administered 2012-02-06: 4 mg via INTRAVENOUS

## 2012-02-06 MED ORDER — HYDROCHLOROTHIAZIDE 12.5 MG PO CAPS
12.5000 mg | ORAL_CAPSULE | Freq: Every day | ORAL | Status: DC
  Administered 2012-02-06 – 2012-02-07 (×2): 12.5 mg via ORAL
  Filled 2012-02-06 (×2): qty 1

## 2012-02-06 MED ORDER — METHOCARBAMOL 500 MG PO TABS
500.0000 mg | ORAL_TABLET | Freq: Four times a day (QID) | ORAL | Status: DC | PRN
  Administered 2012-02-07: 500 mg via ORAL
  Filled 2012-02-06: qty 1

## 2012-02-06 MED ORDER — METOCLOPRAMIDE HCL 5 MG/ML IJ SOLN: 5.0000 mg | Freq: Three times a day (TID) | INTRAMUSCULAR | Status: DC | PRN

## 2012-02-06 MED ORDER — DIPHENHYDRAMINE HCL 25 MG PO CAPS: 25.0000 mg | ORAL_CAPSULE | Freq: Four times a day (QID) | ORAL | Status: DC | PRN

## 2012-02-06 MED ORDER — GLYCOPYRROLATE 0.2 MG/ML IJ SOLN
INTRAMUSCULAR | Status: DC | PRN
  Administered 2012-02-06: 0.6 mg via INTRAVENOUS

## 2012-02-06 MED ORDER — FLEET ENEMA 7-19 GM/118ML RE ENEM: 1.0000 | ENEMA | Freq: Once | RECTAL | Status: AC | PRN

## 2012-02-06 MED ORDER — PROPOFOL 10 MG/ML IV BOLUS
INTRAVENOUS | Status: DC | PRN
  Administered 2012-02-06: 200 mg via INTRAVENOUS

## 2012-02-06 MED ORDER — HYDROMORPHONE HCL PF 1 MG/ML IJ SOLN
INTRAMUSCULAR | Status: DC | PRN
  Administered 2012-02-06: 1 mg via INTRAVENOUS

## 2012-02-06 MED ORDER — CEFAZOLIN SODIUM-DEXTROSE 2-3 GM-% IV SOLR
2.0000 g | INTRAVENOUS | Status: AC
  Administered 2012-02-06: 2 g via INTRAVENOUS

## 2012-02-06 MED ORDER — ACETAMINOPHEN 10 MG/ML IV SOLN
INTRAVENOUS | Status: DC | PRN
  Administered 2012-02-06: 1000 mg via INTRAVENOUS

## 2012-02-06 MED ORDER — TRANEXAMIC ACID 100 MG/ML IV SOLN
15.0000 mg/kg | Freq: Once | INTRAVENOUS | Status: AC
  Administered 2012-02-06: 1327.5 mg via INTRAVENOUS
  Filled 2012-02-06: qty 13.28

## 2012-02-06 MED ORDER — CEFAZOLIN SODIUM-DEXTROSE 2-3 GM-% IV SOLR
2.0000 g | Freq: Four times a day (QID) | INTRAVENOUS | Status: AC
  Administered 2012-02-06 – 2012-02-07 (×2): 2 g via INTRAVENOUS
  Filled 2012-02-06 (×2): qty 50

## 2012-02-06 MED ORDER — ALUM & MAG HYDROXIDE-SIMETH 200-200-20 MG/5ML PO SUSP: 30.0000 mL | ORAL | Status: DC | PRN

## 2012-02-06 MED ORDER — BISACODYL 10 MG RE SUPP: 10.0000 mg | Freq: Every day | RECTAL | Status: DC | PRN

## 2012-02-06 MED ORDER — 0.9 % SODIUM CHLORIDE (POUR BTL) OPTIME
TOPICAL | Status: DC | PRN
  Administered 2012-02-06: 1000 mL

## 2012-02-06 MED ORDER — CELECOXIB 200 MG PO CAPS
200.0000 mg | ORAL_CAPSULE | Freq: Two times a day (BID) | ORAL | Status: DC
  Administered 2012-02-06 – 2012-02-07 (×2): 200 mg via ORAL
  Filled 2012-02-06 (×3): qty 1

## 2012-02-06 MED ORDER — ONDANSETRON HCL 4 MG/2ML IJ SOLN
INTRAMUSCULAR | Status: DC | PRN
  Administered 2012-02-06: 4 mg via INTRAVENOUS

## 2012-02-06 MED ORDER — RIVAROXABAN 10 MG PO TABS
10.0000 mg | ORAL_TABLET | ORAL | Status: DC
  Administered 2012-02-07: 10 mg via ORAL
  Filled 2012-02-06 (×2): qty 1

## 2012-02-06 MED ORDER — METOCLOPRAMIDE HCL 10 MG PO TABS: 5.0000 mg | ORAL_TABLET | Freq: Three times a day (TID) | ORAL | Status: DC | PRN

## 2012-02-06 MED ORDER — LACTATED RINGERS IV SOLN
INTRAVENOUS | Status: DC
  Administered 2012-02-06: 16:00:00 via INTRAVENOUS

## 2012-02-06 MED ORDER — METHOCARBAMOL 100 MG/ML IJ SOLN
500.0000 mg | Freq: Four times a day (QID) | INTRAVENOUS | Status: DC | PRN
  Filled 2012-02-06: qty 5

## 2012-02-06 MED ORDER — SODIUM CHLORIDE 0.9 % IV SOLN
100.0000 mL/h | INTRAVENOUS | Status: DC
  Administered 2012-02-07: 100 mL/h via INTRAVENOUS
  Filled 2012-02-06 (×4): qty 1000

## 2012-02-06 MED ORDER — DEXAMETHASONE SODIUM PHOSPHATE 10 MG/ML IJ SOLN
10.0000 mg | Freq: Once | INTRAMUSCULAR | Status: DC
  Filled 2012-02-06: qty 1

## 2012-02-06 MED ORDER — INFLUENZA VIRUS VACC SPLIT PF IM SUSP
0.5000 mL | INTRAMUSCULAR | Status: DC
  Filled 2012-02-06: qty 0.5

## 2012-02-06 MED ORDER — DEXAMETHASONE SODIUM PHOSPHATE 10 MG/ML IJ SOLN
10.0000 mg | Freq: Once | INTRAMUSCULAR | Status: AC
  Administered 2012-02-07: 10 mg via INTRAVENOUS
  Filled 2012-02-06: qty 1

## 2012-02-06 MED ORDER — DEXAMETHASONE SODIUM PHOSPHATE 10 MG/ML IJ SOLN
INTRAMUSCULAR | Status: DC | PRN
  Administered 2012-02-06: 10 mg via INTRAVENOUS

## 2012-02-06 MED ORDER — SIMVASTATIN 20 MG PO TABS
20.0000 mg | ORAL_TABLET | Freq: Every evening | ORAL | Status: DC
  Administered 2012-02-06: 20 mg via ORAL
  Filled 2012-02-06 (×2): qty 1

## 2012-02-06 MED ORDER — LACTATED RINGERS IV SOLN
INTRAVENOUS | Status: DC
  Administered 2012-02-06: 1000 mL via INTRAVENOUS
  Administered 2012-02-06 (×2): via INTRAVENOUS

## 2012-02-06 MED ORDER — DOCUSATE SODIUM 100 MG PO CAPS
100.0000 mg | ORAL_CAPSULE | Freq: Two times a day (BID) | ORAL | Status: DC
  Administered 2012-02-06 – 2012-02-07 (×2): 100 mg via ORAL

## 2012-02-06 MED ORDER — MENTHOL 3 MG MT LOZG
1.0000 | LOZENGE | OROMUCOSAL | Status: DC | PRN
  Filled 2012-02-06: qty 9

## 2012-02-06 MED ORDER — ONDANSETRON HCL 4 MG/2ML IJ SOLN: 4.0000 mg | Freq: Four times a day (QID) | INTRAMUSCULAR | Status: DC | PRN

## 2012-02-06 MED ORDER — PHENOL 1.4 % MT LIQD
1.0000 | OROMUCOSAL | Status: DC | PRN
  Filled 2012-02-06: qty 177

## 2012-02-06 MED ORDER — POLYETHYLENE GLYCOL 3350 17 G PO PACK
17.0000 g | PACK | Freq: Two times a day (BID) | ORAL | Status: DC
  Administered 2012-02-06 – 2012-02-07 (×2): 17 g via ORAL

## 2012-02-06 SURGICAL SUPPLY — 43 items
ADH SKN CLS APL DERMABOND .7 (GAUZE/BANDAGES/DRESSINGS) ×1
BAG SPEC THK2 15X12 ZIP CLS (MISCELLANEOUS) ×2
BAG ZIPLOCK 12X15 (MISCELLANEOUS) ×4 IMPLANT
BLADE SAW SGTL 18X1.27X75 (BLADE) ×2 IMPLANT
CLOTH BEACON ORANGE TIMEOUT ST (SAFETY) ×2 IMPLANT
DERMABOND ADVANCED (GAUZE/BANDAGES/DRESSINGS) ×1
DERMABOND ADVANCED .7 DNX12 (GAUZE/BANDAGES/DRESSINGS) ×1 IMPLANT
DRAPE C-ARM 42X72 X-RAY (DRAPES) ×2 IMPLANT
DRAPE STERI IOBAN 125X83 (DRAPES) ×2 IMPLANT
DRAPE U-SHAPE 47X51 STRL (DRAPES) ×6 IMPLANT
DRSG AQUACEL AG ADV 3.5X10 (GAUZE/BANDAGES/DRESSINGS) ×2 IMPLANT
DRSG TEGADERM 4X4.75 (GAUZE/BANDAGES/DRESSINGS) ×1 IMPLANT
DURAPREP 26ML APPLICATOR (WOUND CARE) ×2 IMPLANT
ELECT BLADE TIP CTD 4 INCH (ELECTRODE) ×2 IMPLANT
ELECT REM PT RETURN 9FT ADLT (ELECTROSURGICAL) ×2
ELECTRODE REM PT RTRN 9FT ADLT (ELECTROSURGICAL) ×1 IMPLANT
EVACUATOR 1/8 PVC DRAIN (DRAIN) ×1 IMPLANT
FACESHIELD LNG OPTICON STERILE (SAFETY) ×8 IMPLANT
GAUZE SPONGE 2X2 8PLY STRL LF (GAUZE/BANDAGES/DRESSINGS) ×1 IMPLANT
GLOVE BIOGEL PI IND STRL 7.0 (GLOVE) IMPLANT
GLOVE BIOGEL PI IND STRL 7.5 (GLOVE) ×1 IMPLANT
GLOVE BIOGEL PI IND STRL 8 (GLOVE) ×1 IMPLANT
GLOVE BIOGEL PI INDICATOR 7.0 (GLOVE) ×1
GLOVE BIOGEL PI INDICATOR 7.5 (GLOVE) ×1
GLOVE BIOGEL PI INDICATOR 8 (GLOVE) ×2
GLOVE ECLIPSE 8.0 STRL XLNG CF (GLOVE) ×3 IMPLANT
GLOVE ORTHO TXT STRL SZ7.5 (GLOVE) ×4 IMPLANT
GLOVE SURG SS PI 6.5 STRL IVOR (GLOVE) ×1 IMPLANT
GOWN BRE IMP PREV XXLGXLNG (GOWN DISPOSABLE) ×3 IMPLANT
GOWN BRE IMP SLV SIRUS LXLNG (GOWN DISPOSABLE) ×2 IMPLANT
GOWN STRL NON-REIN LRG LVL3 (GOWN DISPOSABLE) ×3 IMPLANT
KIT BASIN OR (CUSTOM PROCEDURE TRAY) ×2 IMPLANT
PACK TOTAL JOINT (CUSTOM PROCEDURE TRAY) ×2 IMPLANT
PADDING CAST COTTON 6X4 STRL (CAST SUPPLIES) ×2 IMPLANT
SPONGE GAUZE 2X2 STER 10/PKG (GAUZE/BANDAGES/DRESSINGS) ×1
SUCTION FRAZIER 12FR DISP (SUCTIONS) ×2 IMPLANT
SUT MNCRL AB 4-0 PS2 18 (SUTURE) ×2 IMPLANT
SUT VIC AB 1 CT1 36 (SUTURE) ×7 IMPLANT
SUT VIC AB 2-0 CT1 27 (SUTURE) ×4
SUT VIC AB 2-0 CT1 TAPERPNT 27 (SUTURE) ×2 IMPLANT
SUT VLOC 180 0 24IN GS25 (SUTURE) ×3 IMPLANT
TOWEL OR 17X26 10 PK STRL BLUE (TOWEL DISPOSABLE) ×4 IMPLANT
TRAY FOLEY CATH 14FRSI W/METER (CATHETERS) ×2 IMPLANT

## 2012-02-06 NOTE — Anesthesia Procedure Notes (Signed)
Procedure Name: Intubation Date/Time: 02/06/2012 1:02 PM Performed by: Doran Clay Pre-anesthesia Checklist: Patient identified, Timeout performed, Emergency Drugs available, Suction available and Patient being monitored Patient Re-evaluated:Patient Re-evaluated prior to inductionOxygen Delivery Method: Circle system utilized Preoxygenation: Pre-oxygenation with 100% oxygen Intubation Type: IV induction Laryngoscope Size: Mac and 4 Grade View: Grade I Tube size: 7.5 mm Number of attempts: 1 Airway Equipment and Method: Stylet Placement Confirmation: ETT inserted through vocal cords under direct vision,  breath sounds checked- equal and bilateral and positive ETCO2 Secured at: 22 cm Tube secured with: Tape Dental Injury: Teeth and Oropharynx as per pre-operative assessment

## 2012-02-06 NOTE — Anesthesia Preprocedure Evaluation (Addendum)
Anesthesia Evaluation  Patient identified by MRN, date of birth, ID band Patient awake    Reviewed: Allergy & Precautions, H&P , NPO status , Patient's Chart, lab work & pertinent test results  Airway Mallampati: III TM Distance: >3 FB Neck ROM: full    Dental  (+) Edentulous Upper and Edentulous Lower   Pulmonary neg pulmonary ROS,  breath sounds clear to auscultation  Pulmonary exam normal       Cardiovascular hypertension, Pt. on medications Rhythm:regular Rate:Normal     Neuro/Psych negative neurological ROS  negative psych ROS   GI/Hepatic negative GI ROS, Neg liver ROS,   Endo/Other  negative endocrine ROS  Renal/GU negative Renal ROS  negative genitourinary   Musculoskeletal   Abdominal   Peds  Hematology negative hematology ROS (+)   Anesthesia Other Findings   Reproductive/Obstetrics negative OB ROS                          Anesthesia Physical Anesthesia Plan  ASA: II  Anesthesia Plan: General   Post-op Pain Management:    Induction: Intravenous  Airway Management Planned: Oral ETT  Additional Equipment:   Intra-op Plan:   Post-operative Plan: Extubation in OR  Informed Consent: I have reviewed the patients History and Physical, chart, labs and discussed the procedure including the risks, benefits and alternatives for the proposed anesthesia with the patient or authorized representative who has indicated his/her understanding and acceptance.   Dental Advisory Given  Plan Discussed with: CRNA and Surgeon  Anesthesia Plan Comments:         Anesthesia Quick Evaluation

## 2012-02-06 NOTE — Transfer of Care (Signed)
Immediate Anesthesia Transfer of Care Note  Patient: Steven Paul  Procedure(s) Performed: Procedure(s) (LRB) with comments: TOTAL HIP ARTHROPLASTY ANTERIOR APPROACH (Right)  Patient Location: PACU  Anesthesia Type: General  Level of Consciousness: awake, sedated and patient cooperative  Airway & Oxygen Therapy: Patient Spontanous Breathing and Patient connected to face mask oxygen  Post-op Assessment: Report given to PACU RN and Post -op Vital signs reviewed and stable  Post vital signs: Reviewed and stable  Complications: No apparent anesthesia complications

## 2012-02-06 NOTE — Anesthesia Postprocedure Evaluation (Signed)
  Anesthesia Post-op Note  Patient: Steven Paul  Procedure(s) Performed: Procedure(s) (LRB): TOTAL HIP ARTHROPLASTY ANTERIOR APPROACH (Right)  Patient Location: PACU  Anesthesia Type: General  Level of Consciousness: awake and alert   Airway and Oxygen Therapy: Patient Spontanous Breathing  Post-op Pain: mild  Post-op Assessment: Post-op Vital signs reviewed, Patient's Cardiovascular Status Stable, Respiratory Function Stable, Patent Airway and No signs of Nausea or vomiting  Post-op Vital Signs: stable  Complications: No apparent anesthesia complications

## 2012-02-06 NOTE — Op Note (Signed)
NAME:  Steven Paul                ACCOUNT NO.: [id number]      MEDICAL RECORD NO.: [id number]      FACILITY:  [facility]      PHYSICIAN:  Durene Romans D  DATE OF BIRTH:  [birth year]     DATE OF PROCEDURE:  02/06/2012                                 OPERATIVE REPORT         PREOPERATIVE DIAGNOSIS: Right  hip osteoarthritis.      POSTOPERATIVE DIAGNOSIS:  Right hip osteoarthritis.      PROCEDURE:  Right total hip replacement through an anterior approach   utilizing DePuy THR system, component size 54mm pinnacle cup, a size 36+4 neutral   Altrex liner, a size 4 Hi Tri Lock stem with a 36+1.5 delta ceramic   ball.      SURGEON:  Madlyn Frankel. Charlann Boxer, M.D.      ASSISTANT:  Leilani Able, PA      ANESTHESIA:  General.      SPECIMENS:  None.      COMPLICATIONS:  None.      BLOOD LOSS:  350 cc     DRAINS:  One Hemovac.      INDICATION OF THE PROCEDURE:  Steven Paul is a 71 y.o. male who had   presented to office for evaluation of right hip pain.  Radiographs revealed   progressive degenerative changes with bone-on-bone   articulation to the  hip joint.  The patient had painful limited range of   motion significantly affecting their overall quality of life.  The patient was failing to    respond to conservative measures, and at this point was ready   to proceed with more definitive measures.  The patient has noted progressive   degenerative changes in his hip, progressive problems and dysfunction   with regarding the hip prior to surgery.  Consent was obtained for   benefit of pain relief.  Specific risk of infection, DVT, component   failure, dislocation, need for revision surgery, as well discussion of   the anterior versus posterior approach were reviewed.  Consent was   obtained for benefit of anterior pain relief through an anterior   approach.      PROCEDURE IN DETAIL:  The patient was brought to operative theater.   Once adequate anesthesia,  preoperative antibiotics, 2gm Ancef administered.   The patient was positioned supine on the OSI Hanna table.  Once adequate   padding of boney process was carried out, we had predraped out the hip, and  used fluoroscopy to confirm orientation of the pelvis and position.      The right hip was then prepped and draped from proximal iliac crest to   mid thigh with shower curtain technique.      Time-out was performed identifying the patient, planned procedure, and   extremity.     An incision was then made 2 cm distal and lateral to the   anterior superior iliac spine extending over the orientation of the   tensor fascia lata muscle and sharp dissection was carried down to the   fascia of the muscle and protractor placed in the soft tissues.      The fascia was then incised.  The muscle belly was identified and  swept   laterally and retractor placed along the superior neck.  Following   cauterization of the circumflex vessels and removing some pericapsular   fat, a second cobra retractor was placed on the inferior neck.  A third   retractor was placed on the anterior acetabulum after elevating the   anterior rectus.  A L-capsulotomy was along the line of the   superior neck to the trochanteric fossa, then extended proximally and   distally.  Tag sutures were placed and the retractors were then placed   intracapsular.  We then identified the trochanteric fossa and   orientation of my neck cut, confirmed this radiographically   and then made a neck osteotomy with the femur on traction.  The femoral   head was removed without difficulty or complication.  Traction was let   off and retractors were placed posterior and anterior around the   acetabulum.      The labrum and foveal tissue were debrided.  I began reaming with a 51mm   reamer and reamed up to 53mm reamer with good bony bed preparation and a 54   cup was chosen.  The final 54mm Pinnacle cup was then impacted under fluoroscopy  to  confirm the depth of penetration and orientation with respect to   abduction.  A screw was placed followed by the hole eliminator.  The final   36+4 neutral Altrex liner was impacted with good visualized rim fit.  The cup was positioned anatomically within the acetabular portion of the pelvis.      At this point, the femur was rolled at 80 degrees.  Further capsule was   released off the inferior aspect of the femoral neck.  I then   released the superior capsule proximally.  The hook was placed laterally   along the femur and elevated manually and held in position with the bed   hook.  The leg was then extended and adducted with the leg rolled to 100   degrees of external rotation.  Once the proximal femur was fully   exposed, I used a box osteotome to set orientation.  I then began   broaching with the starting chili pepper broach and passed this by hand and then broached up to 4.  With the 4 broach in place I chose a high offset neck and did a trial reduction.  With a 36+1.5 trial head I felt that the offset was appropriate, leg lengths appeared to be equal, confirmed radiographically.   Given these findings, I went ahead and dislocated the hip, repositioned all   retractors and positioned the right hip in the extended and abducted position.  The final 4Hi Tri Lock stem was   chosen and it was impacted down to the level of neck cut.  Based on this   and the trial reduction, a 36+1.5 delta ceramic ball was chosen and   impacted onto a clean and dry trunnion, and the hip was reduced.  The   hip had been irrigated throughout the case again at this point.  I did   reapproximate the superior capsular leaflet to the anterior leaflet   using #1 Vicryl, placed a medium Hemovac drain deep.  The fascia of the   tensor fascia lata muscle was then reapproximated using #1 Vicryl.  The   remaining wound was closed with 2-0 Vicryl and running 4-0 Monocryl.   The hip was cleaned, dried, and dressed  sterilely using Dermabond and   Aquacel dressing.  Drain site dressed separately.  She was then brought   to recovery room in stable condition tolerating the procedure well.    Leilani Able, PA-C was present for the entirety of the case involved from   preoperative positioning, perioperative retractor management, general   facilitation of the case, as well as primary wound closure as assistant.            Madlyn Frankel Charlann Boxer, M.D.            MDO/MEDQ  D:  02/28/2011  T:  02/28/2011  Job:  [id number]      Electronically Signed by Durene Romans M.D. on 03/06/2011 09:15:38 AM

## 2012-02-06 NOTE — Interval H&P Note (Signed)
History and Physical Interval Note:  02/06/2012 1:25 PM  Steven Paul  has presented today for surgery, with the diagnosis of right hip osteoarthritis  The various methods of treatment have been discussed with the patient and family. After consideration of risks, benefits and other options for treatment, the patient has consented to  Procedure(s) (LRB) with comments: TOTAL HIP ARTHROPLASTY ANTERIOR APPROACH (Right) as a surgical intervention .  The patient's history has been reviewed, patient examined, no change in status, stable for surgery.  I have reviewed the patient's chart and labs.  Questions were answered to the patient's satisfaction.     [name]

## 2012-02-07 ENCOUNTER — Encounter (HOSPITAL_COMMUNITY): Payer: Self-pay | Admitting: Orthopedic Surgery

## 2012-02-07 DIAGNOSIS — Z6831 Body mass index (BMI) 31.0-31.9, adult: Secondary | ICD-10-CM

## 2012-02-07 DIAGNOSIS — E871 Hypo-osmolality and hyponatremia: Secondary | ICD-10-CM

## 2012-02-07 DIAGNOSIS — D500 Iron deficiency anemia secondary to blood loss (chronic): Secondary | ICD-10-CM

## 2012-02-07 LAB — BASIC METABOLIC PANEL
CO2: 23 mEq/L (ref 19–32)
Chloride: 100 mEq/L (ref 96–112)
Sodium: 134 mEq/L — ABNORMAL LOW (ref 135–145)

## 2012-02-07 LAB — CBC
Platelets: 196 10*3/uL (ref 150–400)
RBC: 3.76 MIL/uL — ABNORMAL LOW (ref 4.22–5.81)
WBC: 11.1 10*3/uL — ABNORMAL HIGH (ref 4.0–10.5)

## 2012-02-07 MED ORDER — FERROUS SULFATE 325 (65 FE) MG PO TABS: 325.0000 mg | ORAL_TABLET | Freq: Three times a day (TID) | ORAL | Status: DC

## 2012-02-07 MED ORDER — DSS 100 MG PO CAPS: 100.0000 mg | ORAL_CAPSULE | Freq: Two times a day (BID) | ORAL | Status: DC

## 2012-02-07 MED ORDER — POLYETHYLENE GLYCOL 3350 17 G PO PACK: 17.0000 g | PACK | Freq: Two times a day (BID) | ORAL | Status: DC

## 2012-02-07 MED ORDER — ASPIRIN EC 325 MG PO TBEC: 325.0000 mg | DELAYED_RELEASE_TABLET | Freq: Two times a day (BID) | ORAL | Status: DC

## 2012-02-07 MED ORDER — DIPHENHYDRAMINE HCL 25 MG PO CAPS: 25.0000 mg | ORAL_CAPSULE | Freq: Four times a day (QID) | ORAL | Status: DC | PRN

## 2012-02-07 MED ORDER — METHOCARBAMOL 500 MG PO TABS: 500.0000 mg | ORAL_TABLET | Freq: Four times a day (QID) | ORAL | Status: DC | PRN

## 2012-02-07 MED ORDER — HYDROCODONE-ACETAMINOPHEN 7.5-325 MG PO TABS: 1.0000 | ORAL_TABLET | ORAL | Status: DC | PRN

## 2012-02-07 NOTE — Progress Notes (Signed)
   Subjective: 1 Day Post-Op Procedure(s) (LRB): TOTAL HIP ARTHROPLASTY ANTERIOR APPROACH (Right)   Patient reports pain as mild, pain well controlled. No events throughout the night. Ready to be discharged home.  Objective:   VITALS:   Filed Vitals:   02/07/12 0658  BP: 147/68  Pulse: 73  Temp: 98.6 F (37 C)  Resp: 16    Neurovascular intact Dorsiflexion/Plantar flexion intact Incision: dressing C/D/I No cellulitis present Compartment soft  LABS  Basename 02/07/12 0350  HGB 11.7*  HCT 33.6*  WBC 11.1*  PLT 196     Basename 02/07/12 0350  NA 134*  K 4.3  BUN 25*  CREATININE 1.24  GLUCOSE 161*     Assessment/Plan: 1 Day Post-Op Procedure(s) (LRB): TOTAL HIP ARTHROPLASTY ANTERIOR APPROACH (Right) HV drain d/c'ed Foley cath d/c'ed Advance diet Up with therapy D/C IV fluids Discharge home with home health Follow up in 2 weeks at [facility]. Follow-up Information    Follow up with OLIN,Narayan Scull D in 2 weeks.   Contact information:   [facility] [address] [place] [address] [phone]          Expected ABLA  Treated with iron and will observe  Overweight (BMI 25-29.9) Estimated Body mass index is 29.65 kg/(m^2) as calculated from the following:   Height as of this encounter: 5\' 8" (1.727 m).   Weight as of this encounter: 195 lb(88.451 kg). Patient also counseled that weight may inhibit the healing process Patient counseled that losing weight will help with future health issues  Hyponatremia Treated with IV fluids and will observe       Anastasio Auerbach. Madylin Fairbank   PAC  02/07/2012, 8:57 AM

## 2012-02-07 NOTE — Progress Notes (Addendum)
Occupational Therapy Note Order noted. Spoke to pt and he states he doesn't need any acute OT. He has help PRN at discharge and he has a higher toilet at home. He declines need to practice tub transfer or toilet transfer. States he doesn't feel he will need a tub seat either. Will sign off per pt request. Judithann Sauger OTR/L [phone] 02/07/2012

## 2012-02-07 NOTE — Progress Notes (Signed)
Utilization review completed.  

## 2012-02-07 NOTE — Evaluation (Signed)
Physical Therapy Evaluation Patient Details Name: Steven Paul MRN: [id number] DOB: Mar 16, 1941 Today's Date: 02/07/2012 Time: 4098-1191 PT Time Calculation (min): 15 min  PT Assessment / Plan / Recommendation Clinical Impression  Pt s/p R direct anterior THR.  Pt would benefit from acute PT services in order to improve independence with transfers, ambulation, and stairs prior to d/c home likely later today or tomorrow.      PT Assessment  Patient needs continued PT services    Follow Up Recommendations  Home health PT    Barriers to Discharge        Equipment Recommendations  None recommended by PT    Recommendations for Other Services     Frequency 7X/week    Precautions / Restrictions Precautions Precautions: None Restrictions RLE Weight Bearing: Weight bearing as tolerated   Pertinent Vitals/Pain No pain at rest, ice applied, repositioned      Mobility  Bed Mobility Bed Mobility: Supine to Sit Supine to Sit: HOB flat;5: Supervision Details for Bed Mobility Assistance: verbal cues for technique Transfers Transfers: Stand to Sit;Sit to Stand Sit to Stand: 4: Min guard;From bed Stand to Sit: 4: Min guard;To chair/3-in-1 Details for Transfer Assistance: verbal cues for safe technique Ambulation/Gait Ambulation/Gait Assistance: 4: Min guard;5: Supervision Ambulation Distance (Feet): 160 Feet Assistive device: Rolling walker Ambulation/Gait Assistance Details: verbal cues for sequence, RW distance, step length Gait Pattern: Step-through pattern;Antalgic;Decreased stance time - right    Shoulder Instructions     Exercises     PT Diagnosis: Difficulty walking  PT Problem List: Decreased strength;Decreased mobility;Decreased knowledge of use of DME PT Treatment Interventions: DME instruction;Gait training;Stair training;Functional mobility training;Therapeutic activities;Therapeutic exercise;Patient/family education   PT Goals Acute Rehab PT Goals PT Goal  Formulation: With patient Time For Goal Achievement: 02/10/12 Potential to Achieve Goals: Good Pt will go Sit to Supine/Side: with modified independence PT Goal: Sit to Supine/Side - Progress: Goal set today Pt will go Sit to Stand: with modified independence PT Goal: Sit to Stand - Progress: Goal set today Pt will go Stand to Sit: with modified independence PT Goal: Stand to Sit - Progress: Goal set today Pt will Ambulate: >150 feet;with modified independence;with least restrictive assistive device PT Goal: Ambulate - Progress: Goal set today Pt will Go Up / Down Stairs: 3-5 stairs;with least restrictive assistive device;with supervision PT Goal: Up/Down Stairs - Progress: Goal set today Pt will Perform Home Exercise Program: with supervision, verbal cues required/provided PT Goal: Perform Home Exercise Program - Progress: Goal set today  Visit Information  Last PT Received On: 02/07/12 Assistance Needed: +1    Subjective Data  Subjective: I had a posterior done on my L side.   Prior Functioning  Home Living Lives With: Spouse Type of Home: House Home Access: Stairs to enter Secretary/administrator of Steps: 4 Entrance Stairs-Rails: None Home Layout: One level Home Adaptive Equipment: Walker - rolling;Straight cane Prior Function Level of Independence: Independent Communication Communication: No difficulties    Cognition  Overall Cognitive Status: Appears within functional limits for tasks assessed/performed Arousal/Alertness: Awake/alert Orientation Level: Appears intact for tasks assessed Behavior During Session: [facility] for tasks performed    Extremity/Trunk Assessment Right Upper Extremity Assessment RUE ROM/Strength/Tone: [facility] for tasks assessed Left Upper Extremity Assessment LUE ROM/Strength/Tone: WFL for tasks assessed Right Lower Extremity Assessment RLE ROM/Strength/Tone: Deficits RLE ROM/Strength/Tone Deficits: decreased active movement against gravity per  functional observation Left Lower Extremity Assessment LLE ROM/Strength/Tone: [facility] for tasks assessed   Balance    End  of Session PT - End of Session Activity Tolerance: Patient tolerated treatment well Patient left: in chair;with call bell/phone within reach;with family/visitor present  GP     Chelly Dombeck,KATHrine E 02/07/2012, 10:35 AM Pager: [phone]

## 2012-02-07 NOTE — Progress Notes (Signed)
Physical Therapy Treatment   02/07/12 1500  PT Visit Information  Last PT Received On 02/07/12  Assistance Needed +1  PT Time Calculation  PT Start Time 1414  PT Stop Time 1441  PT Time Calculation (min) 27 min  Subjective Data  Subjective So, what's the plan.  (ambulation, stairs, exercises)  Precautions  Precautions None  Restrictions  RLE Weight Bearing WBAT  Cognition  Overall Cognitive Status Appears within functional limits for tasks assessed/performed  Transfers  Transfers Stand to Sit;Sit to Stand  Sit to Stand 5: Supervision;With upper extremity assist;From chair/3-in-1  Stand to Sit 5: Supervision;With upper extremity assist;To chair/3-in-1  Details for Transfer Assistance verbal cues for safe technique  Ambulation/Gait  Ambulation/Gait Assistance 5: Supervision  Ambulation Distance (Feet) 250 Feet  Assistive device Rolling walker  Ambulation/Gait Assistance Details verbal cue for step length  Gait Pattern Step-through pattern;Antalgic;Decreased stance time - right  Stairs Yes  Stairs Assistance 4: Min guard  Stairs Assistance Details (indicate cue type and reason) verbal cues for safe technique and sequence  Stair Management Technique Backwards;Step to pattern;With walker  Number of Stairs 4   Total Joint Exercises  Ankle Circles/Pumps AROM;Both;20 reps  Quad Sets AROM;Both;20 reps  Gluteal Sets AROM;Both;20 reps  Towel Squeeze AROM;Strengthening;Both;20 reps  Short Arc Quad AROM;Strengthening;Right;15 reps  Hip ABduction/ADduction Strengthening;15 reps;AAROM;Right  Straight Leg Raises AAROM;Strengthening;Right;15 reps  Long Arc Quad AROM;Strengthening;Right;15 reps  PT - End of Session  Activity Tolerance Patient tolerated treatment well  Patient left in chair;with call bell/phone within reach  PT - Assessment/Plan  Comments on Treatment Session Pt ambulated to stairs, performed 4 steps then ambulated back to room.  Pt doing very well with mobility.  Also  educated and performed exercises and discussed car transfer.  Pt ready for d/c home today.  PT Plan Discharge plan remains appropriate;Frequency remains appropriate  Follow Up Recommendations Home health PT  Equipment Recommended None recommended by PT  Acute Rehab PT Goals  PT Goal: Stand to Sit - Progress Progressing toward goal  PT Goal: Ambulate - Progress Progressing toward goal  PT Goal: Up/Down Stairs - Progress Progressing toward goal  PT Goal: Perform Home Exercise Program - Progress Progressing toward goal  PT General Charges  $$ ACUTE PT VISIT 1 Procedure  PT Treatments  $Gait Training 8-22 mins  $Therapeutic Exercise 8-22 mins    Zenovia Jarred, PT Pager: [phone]

## 2012-02-07 NOTE — Care Management Note (Signed)
    Page 1 of 2   02/07/2012     12:15:52 PM   CARE MANAGEMENT NOTE 02/07/2012  Patient:  Steven Paul, Steven Paul   Account Number:  [id number]  Date Initiated:  02/07/2012  Documentation initiated by:  Colleen Can  Subjective/Objective Assessment:   dx rt hip osteoarthritis; otal hip arthroplasty-anterior approach  [facility] referral from doctor's office     Action/Plan:   CM spoke with patient and family members. Pt planning to return to his home in [place] where spouse will be caregiver. Already has RW and cane. Wants to use [facility] agency that MD made referral to.   Anticipated DC Date:  02/08/2012   Anticipated DC Plan:  HOME W HOME HEALTH SERVICES  In-house referral  Clinical Social Worker      DC Planning Services  CM consult      [facility] Choice  HOME HEALTH   Choice offered to / List presented to:  [name]   DME arranged  NA      DME agency  NA     HH arranged  HH-2 PT      [facility] agency  [facility]   Status of service:  Completed, signed off Medicare Important Message given?  NO (If response is "NO", the following Medicare IM given date fields will be blank) Date Medicare IM given:   Date Additional Medicare IM given:    Discharge Disposition:    Per UR Regulation:  Reviewed for med. necessity/level of care/duration of stay  If discussed at Long Length of Stay Meetings, dates discussed:    Comments:  02/07/2012 [name] BSN CCM [phone] [name] will start HHPT services day after pt is discharge to home.

## 2012-02-09 NOTE — Discharge Summary (Signed)
Physician Discharge Summary  Patient ID: ADANTE COURINGTON MRN: [id number] DOB/AGE: [birth year] 70 y.o.  Admit date: 02/06/2012 Discharge date: 02/07/2012  Procedures:  Procedure(s) (LRB): TOTAL HIP ARTHROPLASTY ANTERIOR APPROACH (Right)  Attending Physician:  Dr. Durene Romans   Admission Diagnoses:   Right hip OA and pain   Discharge Diagnoses:  Principal Problem:  *S/P right THA, AA Active Problems:  Overweight (BMI 25.0-29.9)  Hyponatremia  Expected blood loss anemia Hypertension  Hyperlipidemia   Internal hemorrhoids   Tinnitus  HPI: Pt is a 71 y.o. male complaining of significant right hip pain for 6-7 months. Pain had continually increased since the beginning. X-rays in the clinic show end-stage arthritic changes of the right hip. Pt has tried various conservative treatments which have failed to alleviate their symptoms. Various options are discussed with the patient. Risks, benefits and expectations were discussed with the patient. Patient understand the risks, benefits and expectations and wishes to proceed with surgery.  PCP: Tonye Pearson, MD   Discharged Condition: good  Hospital Course:  Patient underwent the above stated procedure on 02/06/2012. Patient tolerated the procedure well and brought to the recovery room in good condition and subsequently to the floor.  POD #1 BP: 147/68 ; Pulse: 73 ; Temp: 98.6 F (37 C) ; Resp: 16  Pt's foley was removed, as well as the hemovac drain removed. IV was changed to a saline lock. Patient reports pain as mild, pain well controlled. No events throughout the night. Ready to be discharged home. Neurovascular intact, dorsiflexion/plantar flexion intact, incision: dressing C/D/I, no cellulitis present and compartment soft.   LABS  Basename  02/07/12 0350   HGB  11.7  HCT  33.6    Discharge Exam: General appearance: alert, cooperative and no distress Extremities: Homans sign is negative, no sign of DVT, no edema,  redness or tenderness in the calves or thighs and no ulcers, gangrene or trophic changes  Disposition:  Home-Health Care Svc with follow up in 2 weeks   Follow-up Information    Follow up with [name], MD. In 2 weeks.   Contact information:   [facility] [address] [place] [address] [phone]          Discharge Orders    Future Appointments: Provider: Department: Dept Phone: Center:   03/20/2012 11:30 AM Tonye Pearson, MD [facility] [phone] [facility]     Future Orders Please Complete By Expires   Diet - low sodium heart healthy      Call MD / Call 911      Comments:   If you experience chest pain or shortness of breath, CALL 911 and be transported to the hospital emergency room.  If you develope a fever above 101 F, pus (white drainage) or increased drainage or redness at the wound, or calf pain, call your surgeon's office.   Discharge instructions      Comments:   Maintain surgical dressing for 8 days, then replace with gauze and tape. Keep the area dry and clean until follow up. Follow up in 2 weeks at [facility]. Call with any questions or concerns.   Constipation Prevention      Comments:   Drink plenty of fluids.  Prune juice may be helpful.  You may use a stool softener, such as Colace (over the counter) 100 mg twice a day.  Use MiraLax (over the counter) for constipation as needed.   Increase activity slowly as tolerated  Driving restrictions      Comments:   No driving for 4 weeks   Change dressing      Comments:   Maintain surgical dressing for 8 days, then replace with 4x4 guaze and tape. Keep the area dry and clean.   TED hose      Comments:   Use stockings (TED hose) for 2 weeks on both leg(s).  You may remove them at night for sleeping.      Discharge Medication List as of 02/07/2012 11:43 AM    START taking these medications   Details  aspirin EC 325 MG tablet Take 1 tablet  (325 mg total) by mouth 2 (two) times daily. X 4 weeks, Starting 02/07/2012, Until Discontinued, No Print    diphenhydrAMINE (BENADRYL) 25 mg capsule Take 1 capsule (25 mg total) by mouth every 6 (six) hours as needed for itching, allergies or sleep., Starting 02/07/2012, Until Discontinued, No Print    docusate sodium 100 MG CAPS Take 100 mg by mouth 2 (two) times daily., Starting 02/07/2012, Until Discontinued, No Print    ferrous sulfate 325 (65 FE) MG tablet Take 1 tablet (325 mg total) by mouth 3 (three) times daily after meals., Starting 02/07/2012, Until Discontinued, No Print    HYDROcodone-acetaminophen (NORCO) 7.5-325 MG per tablet Take 1-2 tablets by mouth every 4 (four) hours as needed for pain., Starting 02/07/2012, Until Discontinued, Print    methocarbamol (ROBAXIN) 500 MG tablet Take 1 tablet (500 mg total) by mouth every 6 (six) hours as needed (muscle spasms)., Starting 02/07/2012, Until Discontinued, No Print    polyethylene glycol (MIRALAX / GLYCOLAX) packet Take 17 g by mouth 2 (two) times daily., Starting 02/07/2012, Until Discontinued, No Print      CONTINUE these medications which have NOT CHANGED   Details  lisinopril-hydrochlorothiazide (PRINZIDE,ZESTORETIC) 20-12.5 MG per tablet Take 1 tablet by mouth daily with breakfast., Until Discontinued, Historical Med    simvastatin (ZOCOR) 20 MG tablet Take 20 mg by mouth every evening., Until Discontinued, Historical Med         Signed: Anastasio Auerbach. Alailah Safley   PAC  02/09/2012, 5:40 PM

## 2012-03-04 ENCOUNTER — Other Ambulatory Visit: Payer: Self-pay | Admitting: Internal Medicine

## 2012-03-04 NOTE — Telephone Encounter (Signed)
Pt needs OV, per last note, supposed to follow-up in September 2013

## 2012-03-20 ENCOUNTER — Encounter: Payer: Self-pay | Admitting: Internal Medicine

## 2012-03-20 ENCOUNTER — Ambulatory Visit (INDEPENDENT_AMBULATORY_CARE_PROVIDER_SITE_OTHER): Payer: PRIVATE HEALTH INSURANCE | Admitting: Internal Medicine

## 2012-03-20 VITALS — BP 134/90 | HR 60 | Temp 98.9°F | Resp 16 | Ht 65.5 in | Wt 190.8 lb

## 2012-03-20 DIAGNOSIS — R739 Hyperglycemia, unspecified: Secondary | ICD-10-CM

## 2012-03-20 DIAGNOSIS — I10 Essential (primary) hypertension: Secondary | ICD-10-CM

## 2012-03-20 DIAGNOSIS — E785 Hyperlipidemia, unspecified: Secondary | ICD-10-CM

## 2012-03-20 DIAGNOSIS — E871 Hypo-osmolality and hyponatremia: Secondary | ICD-10-CM

## 2012-03-20 DIAGNOSIS — D500 Iron deficiency anemia secondary to blood loss (chronic): Secondary | ICD-10-CM

## 2012-03-20 DIAGNOSIS — E7800 Pure hypercholesterolemia, unspecified: Secondary | ICD-10-CM

## 2012-03-20 LAB — LIPID PANEL
HDL: 45 mg/dL (ref >39)
LDL Cholesterol: 87 mg/dL (ref 0–99)
Total CHOL/HDL Ratio: 3.9 Ratio

## 2012-03-20 LAB — COMPREHENSIVE METABOLIC PANEL
AST: 16 U/L (ref 0–37)
Albumin: 4.9 g/dL (ref 3.5–5.2)
Alkaline Phosphatase: 94 U/L (ref 39–117)
BUN: 16 mg/dL (ref 6–23)
Potassium: 4.4 mEq/L (ref 3.5–5.3)

## 2012-03-20 MED ORDER — SIMVASTATIN 20 MG PO TABS: 20.0000 mg | ORAL_TABLET | Freq: Every day | ORAL | Status: DC

## 2012-03-20 MED ORDER — LISINOPRIL-HYDROCHLOROTHIAZIDE 20-12.5 MG PO TABS: 1.0000 | ORAL_TABLET | Freq: Every day | ORAL | Status: DC

## 2012-03-20 NOTE — Progress Notes (Signed)
  Subjective:    Patient ID: Steven Paul, male    DOB: [birth year], 71 y.o.   MRN: [id number]  HPIRoutine followup Patient Active Problem List  Diagnosis  . HTN (hypertension)  . Hyperlipemia  . Hyperglycemia  . S/P right THA, AA  . Overweight (BMI 25.0-29.9)  . Hyponatremia  . Expected blood loss anemia   Second hip replacement one month ago and doing extremely well  Had hyponatremia and low hemoglobin after surgery and these need to be rechecked  Review of Systems Negative for new symptoms Continues to be active without cardiovascular symptoms    Objective:   Physical Exam Vital signs stable Heart regular Neurological intact       Assessment & Plan:   1. Expected blood loss anemia  CBC with Differential, Hemoglobin A1c  2. HTN (hypertension)  Comprehensive metabolic panel, Hemoglobin A1c  3. Hyperglycemia  Hemoglobin A1c  4. Hyperlipemia  Lipid panel, Hemoglobin A1c  5. Hyponatremia  Comprehensive metabolic panel, Hemoglobin A1c   Notify labs-may discontinue iron if hgb nl   Meds ordered this encounter  Medications  . lisinopril-hydrochlorothiazide (PRINZIDE,ZESTORETIC) 20-12.5 MG per tablet    Sig: Take 1 tablet by mouth daily.    Dispense:  90 tablet    Refill:  2    Pt needs office visit  . simvastatin (ZOCOR) 20 MG tablet    Sig: Take 1 tablet (20 mg total) by mouth daily.    Dispense:  90 tablet    Refill:  2    Pt needs office visit   CPE in 9 months

## 2012-03-21 LAB — CBC WITH DIFFERENTIAL/PLATELET
Basophils Absolute: 0 10*3/uL (ref 0.0–0.1)
Basophils Relative: 0 % (ref 0–1)
MCHC: 35.3 g/dL (ref 30.0–36.0)
Monocytes Absolute: 1 10*3/uL (ref 0.1–1.0)
Neutro Abs: 4.3 10*3/uL (ref 1.7–7.7)
Neutrophils Relative %: 55 % (ref 43–77)
Platelets: 251 10*3/uL (ref 150–400)
RDW: 14.8 % (ref 11.5–15.5)

## 2012-03-21 LAB — HEMOGLOBIN A1C: Hgb A1c MFr Bld: 6 % — ABNORMAL HIGH (ref <5.7)

## 2012-03-25 ENCOUNTER — Encounter: Payer: Self-pay | Admitting: Internal Medicine

## 2012-11-16 ENCOUNTER — Encounter (HOSPITAL_COMMUNITY): Payer: Self-pay | Admitting: Family Medicine

## 2012-11-16 ENCOUNTER — Emergency Department (HOSPITAL_COMMUNITY)
Admission: EM | Admit: 2012-11-16 | Discharge: 2012-11-16 | Disposition: A | Discharge status: Home / Self Care | Payer: PRIVATE HEALTH INSURANCE | Attending: Emergency Medicine | Admitting: Emergency Medicine

## 2012-11-16 DIAGNOSIS — E785 Hyperlipidemia, unspecified: Secondary | ICD-10-CM | POA: Insufficient documentation

## 2012-11-16 DIAGNOSIS — E860 Dehydration: Secondary | ICD-10-CM | POA: Insufficient documentation

## 2012-11-16 DIAGNOSIS — Z8669 Personal history of other diseases of the nervous system and sense organs: Secondary | ICD-10-CM | POA: Insufficient documentation

## 2012-11-16 DIAGNOSIS — Z8701 Personal history of pneumonia (recurrent): Secondary | ICD-10-CM | POA: Insufficient documentation

## 2012-11-16 DIAGNOSIS — R7989 Other specified abnormal findings of blood chemistry: Secondary | ICD-10-CM

## 2012-11-16 DIAGNOSIS — R110 Nausea: Secondary | ICD-10-CM | POA: Insufficient documentation

## 2012-11-16 DIAGNOSIS — R42 Dizziness and giddiness: Secondary | ICD-10-CM | POA: Insufficient documentation

## 2012-11-16 DIAGNOSIS — I10 Essential (primary) hypertension: Secondary | ICD-10-CM | POA: Insufficient documentation

## 2012-11-16 DIAGNOSIS — R55 Syncope and collapse: Secondary | ICD-10-CM

## 2012-11-16 DIAGNOSIS — R748 Abnormal levels of other serum enzymes: Secondary | ICD-10-CM | POA: Insufficient documentation

## 2012-11-16 DIAGNOSIS — Z8679 Personal history of other diseases of the circulatory system: Secondary | ICD-10-CM | POA: Insufficient documentation

## 2012-11-16 DIAGNOSIS — Z79899 Other long term (current) drug therapy: Secondary | ICD-10-CM | POA: Insufficient documentation

## 2012-11-16 LAB — CBC WITH DIFFERENTIAL/PLATELET
Basophils Absolute: 0.1 10*3/uL (ref 0.0–0.1)
Basophils Relative: 1 % (ref 0–1)
Eosinophils Absolute: 0 10*3/uL (ref 0.0–0.7)
MCH: 31.7 pg (ref 26.0–34.0)
MCHC: 35.4 g/dL (ref 30.0–36.0)
Neutrophils Relative %: 69 % (ref 43–77)
Platelets: 174 10*3/uL (ref 150–400)
RBC: 4.63 MIL/uL (ref 4.22–5.81)

## 2012-11-16 LAB — BASIC METABOLIC PANEL
GFR calc Af Amer: 44 mL/min — ABNORMAL LOW (ref >90)
GFR calc non Af Amer: 38 mL/min — ABNORMAL LOW (ref >90)
Potassium: 4.2 mEq/L (ref 3.5–5.1)
Sodium: 143 mEq/L (ref 135–145)

## 2012-11-16 MED ORDER — SODIUM CHLORIDE 0.9 % IV BOLUS (SEPSIS)
1000.0000 mL | Freq: Once | INTRAVENOUS | Status: AC
  Administered 2012-11-16: 1000 mL via INTRAVENOUS

## 2012-11-16 MED ORDER — ONDANSETRON HCL 4 MG/2ML IJ SOLN
4.0000 mg | Freq: Once | INTRAMUSCULAR | Status: AC
  Administered 2012-11-16: 4 mg via INTRAVENOUS
  Filled 2012-11-16: qty 2

## 2012-11-16 NOTE — ED Notes (Signed)
[place]. sts was very dizzy before episode. Denies chest pain, SOB. CBG 140. BP initially 50 sys when EMS arrived. 120 sys after fluids. sts breakfast this am and Gatorade.

## 2012-11-16 NOTE — ED Provider Notes (Signed)
I evaluated the patient in conjunction with the resident physician.  I saw the relevant studies, including the ecg (as needed) and agree with the interpretation. The documentation is accurate with the following additional / clarifications: This patient presents after an episode of syncope.  By the time of my exam the patient is awake and alert, appropriately oriented.  Although the patient had syncope, and was advised of the benefits of admission, the patient requested discharge with outpatient evaluation.  Although he has advanced age, other risk factors are minimal, and this request was accommodated.  Gerhard Munch, MD 11/16/12 2337

## 2012-11-16 NOTE — ED Provider Notes (Signed)
History    CSN: [id number] Arrival date & time 11/16/12  1737  First MD Initiated Contact with Patient 11/16/12 1749     Chief Complaint  Patient presents with  . Loss of Consciousness   (Consider location/radiation/quality/duration/timing/severity/associated sxs/prior Treatment) HPI Comments: Pt w/ hx of HTN, HLD now w/ new onset syncope. 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 Ate sandwich for lunch. Drank minimal Gatorade and water. Denies ETOH. Finished race, was loading boat. Walking and became acutely dizzy and LH, sat down, drank some gatorade and syncopized. Per witness no seizure activity. No prodrome of palpitation, dyspnea, chest pain or HA. EMS called and glucose 140, noted to be hypotensive initially w/ pressure 50/palp, given 600cc IVF and placed in trendelenburg. On arrival pt only c/o mild nausea. Denies diplopia, blurred vision, dizziness. No hx of CAD/CHF   Patient is a 72 y.o. male presenting with general illness. The history is provided by the patient. No language interpreter was used.  Illness Location:  Cns Quality:  Syncope Severity:  Mild Onset quality:  Sudden Timing: once. Progression:  Resolved Chronicity:  New Associated symptoms: no abdominal pain, no chest pain, no congestion, no cough, no diarrhea, no fever, no headaches, no nausea, no rash, no shortness of breath, no sore throat and no vomiting    Past Medical History  Diagnosis Date  . Hypertension   . Hyperlipidemia   . Internal hemorrhoids   . Pneumonia 05/2008  . Tinnitus     both ears all the time   Past Surgical History  Procedure Laterality Date  . Tonsillectomy and adenoidectomy  as child  . 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 Total hip arthroplasty  02/06/2012    Procedure: TOTAL HIP ARTHROPLASTY ANTERIOR APPROACH;  Surgeon: [name], MD;  Location: [facility];  Service: Orthopedics;  Laterality: Right;   Family History  Problem Relation Age of Onset  . Heart disease  Mother   . Heart disease Father    History  Substance Use Topics  . Smoking status: Never Smoker   . Smokeless tobacco: Never Used  . Alcohol Use: No     Comment: beer sometimes    Review of Systems  Constitutional: Negative for fever and chills.  HENT: Negative for congestion and sore throat.   Respiratory: Negative for cough and shortness of breath.   Cardiovascular: Negative for chest pain and leg swelling.  Gastrointestinal: Negative for nausea, vomiting, abdominal pain, diarrhea and constipation.  Genitourinary: Negative for dysuria and frequency.  Skin: Negative for color change and rash.  Neurological: Positive for dizziness and syncope. Negative for headaches.  Psychiatric/Behavioral: Negative for confusion and agitation.  All other systems reviewed and are negative.    Allergies  Review of patient's allergies indicates no known allergies.  Home Medications   Current Outpatient Rx  Name  Route  Sig  Dispense  Refill  . aspirin EC 325 MG tablet   Oral   Take 1 tablet (325 mg total) by mouth 2 (two) times daily. X 4 weeks   60 tablet   0   . ferrous sulfate 325 (65 FE) MG tablet   Oral   Take 1 tablet (325 mg total) by mouth 3 (three) times daily after meals.         Marland Kitchen lisinopril-hydrochlorothiazide (PRINZIDE,ZESTORETIC) 20-12.5 MG per tablet   Oral   Take 1 tablet by mouth daily.   90 tablet   2  Pt needs office visit   . simvastatin (ZOCOR) 20 MG tablet   Oral   Take 1 tablet (20 mg total) by mouth daily.   90 tablet   2     Pt needs office visit    BP 138/80  Pulse 73  Temp(Src) 98 F (36.7 C) (Oral)  Resp 18  SpO2 98% Physical Exam  Constitutional: He is oriented to person, place, and time. He appears well-developed and well-nourished. No distress.  HENT:  Head: Normocephalic and atraumatic.  Eyes: EOM are normal. Pupils are equal, round, and reactive to light.  Neck: Normal range of motion. Neck supple.  Cardiovascular: Normal  rate, regular rhythm and normal heart sounds.   No murmur heard. Pulmonary/Chest: Effort normal and breath sounds normal. No respiratory distress.  Abdominal: Soft. He exhibits no distension.  Musculoskeletal: Normal range of motion. He exhibits no edema.       Right lower leg: He exhibits no edema.       Left lower leg: He exhibits no edema.  Neurological: He is alert and oriented to person, place, and time. He has normal strength. No cranial nerve deficit or sensory deficit. Coordination normal. GCS eye subscore is 4. GCS verbal subscore is 5. GCS motor subscore is 6.  Skin: Skin is warm and dry.  Psychiatric: He has a normal mood and affect. His behavior is normal.    ED Course  Procedures (including critical care time) Labs Reviewed  CBC WITH DIFFERENTIAL  BASIC METABOLIC PANEL   Date: 11/16/2012  Rate: 66  Rhythm: normal sinus rhythm  QRS Axis: normal  Intervals: normal  ST/T Wave abnormalities: TWI isolated III  Conduction Disutrbances:none  Narrative Interpretation:   Old EKG Reviewed: unchanged  Results for orders placed during the hospital encounter of 11/16/12  CBC WITH DIFFERENTIAL      Result Value Range   WBC 9.4  4.0 - 10.5 K/uL   RBC 4.63  4.22 - 5.81 MIL/uL   Hemoglobin 14.7  13.0 - 17.0 g/dL   HCT 69.6  29.5 - 28.4 %   MCV 89.6  78.0 - 100.0 fL   MCH 31.7  26.0 - 34.0 pg   MCHC 35.4  30.0 - 36.0 g/dL   RDW 13.2  44.0 - 10.2 %   Platelets 174  150 - 400 K/uL   Neutrophils Relative % 69  43 - 77 %   Neutro Abs 6.4  1.7 - 7.7 K/uL   Lymphocytes Relative 16  12 - 46 %   Lymphs Abs 1.5  0.7 - 4.0 K/uL   Monocytes Relative 14 (*) 3 - 12 %   Monocytes Absolute 1.4 (*) 0.1 - 1.0 K/uL   Eosinophils Relative 0  0 - 5 %   Eosinophils Absolute 0.0  0.0 - 0.7 K/uL   Basophils Relative 1  0 - 1 %   Basophils Absolute 0.1  0.0 - 0.1 K/uL  BASIC METABOLIC PANEL      Result Value Range   Sodium 143  135 - 145 mEq/L   Potassium 4.2  3.5 - 5.1 mEq/L   Chloride 108   96 - 112 mEq/L   CO2 25  19 - 32 mEq/L   Glucose, Bld 137 (*) 70 - 99 mg/dL   BUN 23  6 - 23 mg/dL   Creatinine, Ser 7.25 (*) 0.50 - 1.35 mg/dL   Calcium 9.6  8.4 - 36.6 mg/dL   GFR calc non Af Amer 38 (*) >90 mL/min  GFR calc Af Amer 44 (*) >90 mL/min     No results found. No diagnosis found.  MDM  Exam as above - normotensive, asymptomatic, GCS 15, no focal neuro deficit on exam, no murmur, no s/s of heart failure on exam. ECG w/out acute ischemia or interval changes - no long QT, short PR, brugada or LVH. CBC w/out evidence of anemia - Hct 41. Cr elevated at 1.74 - BUN 23. Orthostatics negative. Cr elevated possible 2/2 dehydration. Given 1 L IVF. D/w w/ pt regarding recommendation for inpt eval based  on age - higher risk. Pt states at this time he does not want to come into hospital. He already has close follow up apt w/ pcp in 5 days for full physical. At this time i feel pt is stable for d/c home. Hemodynamics stable, asymptomatic. Syncope likely 2/2 dehydration. Doubt arrhythmia, anemia, ACS, CHF, CVA/SAH/TIA. Recommend outpt holter monitor and repeat Cr on Wednesday. Given strict return precautions. D/c home in good and stable condition.   I have personally reviewed ECG and labs and considered in my MDM. Case d/w Dr Jeraldine Loots  1. Syncope   2. Dehydration   3. Elevated serum creatinine    New Prescriptions   No medications on file   Tonye Pearson, MD [address] [place] [address] [phone]  On 11/20/2012 for physical and repeat creatinine     Audelia Hives, MD 11/16/12 2001

## 2012-11-20 ENCOUNTER — Ambulatory Visit (INDEPENDENT_AMBULATORY_CARE_PROVIDER_SITE_OTHER): Payer: PRIVATE HEALTH INSURANCE | Admitting: Internal Medicine

## 2012-11-20 ENCOUNTER — Encounter: Payer: Self-pay | Admitting: Internal Medicine

## 2012-11-20 VITALS — BP 158/94 | HR 64 | Temp 98.1°F | Resp 16 | Ht 65.5 in | Wt 193.4 lb

## 2012-11-20 DIAGNOSIS — R7989 Other specified abnormal findings of blood chemistry: Secondary | ICD-10-CM

## 2012-11-20 DIAGNOSIS — Z0000 Encounter for general adult medical examination without abnormal findings: Secondary | ICD-10-CM

## 2012-11-20 DIAGNOSIS — I10 Essential (primary) hypertension: Secondary | ICD-10-CM

## 2012-11-20 DIAGNOSIS — R799 Abnormal finding of blood chemistry, unspecified: Secondary | ICD-10-CM

## 2012-11-20 DIAGNOSIS — R739 Hyperglycemia, unspecified: Secondary | ICD-10-CM

## 2012-11-20 DIAGNOSIS — E663 Overweight: Secondary | ICD-10-CM

## 2012-11-20 DIAGNOSIS — E785 Hyperlipidemia, unspecified: Secondary | ICD-10-CM

## 2012-11-20 DIAGNOSIS — R7309 Other abnormal glucose: Secondary | ICD-10-CM

## 2012-11-20 LAB — CBC WITH DIFFERENTIAL/PLATELET
HCT: 40.6 % (ref 39.0–52.0)
Hemoglobin: 14.5 g/dL (ref 13.0–17.0)
Lymphocytes Relative: 35 % (ref 12–46)
Monocytes Absolute: 0.7 10*3/uL (ref 0.1–1.0)
Monocytes Relative: 13 % — ABNORMAL HIGH (ref 3–12)
Neutro Abs: 2.7 10*3/uL (ref 1.7–7.7)
Neutrophils Relative %: 49 % (ref 43–77)
RBC: 4.68 MIL/uL (ref 4.22–5.81)
WBC: 5.5 10*3/uL (ref 4.0–10.5)

## 2012-11-20 LAB — IFOBT (OCCULT BLOOD): IFOBT: NEGATIVE

## 2012-11-20 LAB — COMPREHENSIVE METABOLIC PANEL
AST: 29 U/L (ref 0–37)
Albumin: 4.7 g/dL (ref 3.5–5.2)
Alkaline Phosphatase: 70 U/L (ref 39–117)
BUN: 16 mg/dL (ref 6–23)
Calcium: 9.5 mg/dL (ref 8.4–10.5)
Chloride: 106 mEq/L (ref 96–112)
Glucose, Bld: 103 mg/dL — ABNORMAL HIGH (ref 70–99)
Potassium: 3.9 mEq/L (ref 3.5–5.3)
Sodium: 139 mEq/L (ref 135–145)
Total Protein: 6.3 g/dL (ref 6.0–8.3)

## 2012-11-20 LAB — LIPID PANEL
Cholesterol: 160 mg/dL (ref 0–200)
LDL Cholesterol: 95 mg/dL (ref 0–99)
Triglycerides: 122 mg/dL (ref <150)
VLDL: 24 mg/dL (ref 0–40)

## 2012-11-20 MED ORDER — SIMVASTATIN 20 MG PO TABS: 20.0000 mg | ORAL_TABLET | Freq: Every evening | ORAL | Status: DC

## 2012-11-20 MED ORDER — LISINOPRIL-HYDROCHLOROTHIAZIDE 20-12.5 MG PO TABS: 1.0000 | ORAL_TABLET | Freq: Every day | ORAL | Status: DC

## 2012-11-20 NOTE — Progress Notes (Signed)
  Subjective:    Patient ID: Steven Paul, male    DOB: May 09, 1940, 72 y.o.   MRN: [id number]  HPI    Review of Systems  Constitutional: Negative.   HENT: Negative.   Eyes: Negative.   Respiratory: Negative.   Cardiovascular: Negative.   Gastrointestinal: Negative.   Endocrine: Negative.   Genitourinary: Negative.   Musculoskeletal: Negative.   Skin: Negative.   Allergic/Immunologic: Negative.   Neurological: Negative.   Hematological: Negative.   Psychiatric/Behavioral: Negative.        Objective:   Physical Exam        Assessment & Plan:

## 2012-11-20 NOTE — Progress Notes (Signed)
  Subjective:    Patient ID: Steven Paul, male    DOB: Mar 03, 1941, 72 y.o.   MRN: [id number]  HPI  Patient Active Problem List   Diagnosis Date Noted  . Overweight (BMI 25.0-29.9) 02/07/2012  . HTN (hypertension) 08/02/2011  . Hyperlipemia 08/02/2011  . Hyperglycemia 08/02/2011  L hip repl 07--dupay OLIN R hip repl 10/13  See recent syncope after dehydr during regatta//stable since IV fluids Continues hockey Continues fulltime work  Review of Systems See cna note-all neg    Objective:   Physical Exam  Constitutional: He is oriented to person, place, and time. He appears well-developed and well-nourished.  HENT:  Head: Normocephalic.  Right Ear: External ear normal.  Left Ear: External ear normal.  Nose: Nose normal.  Mouth/Throat: Oropharynx is clear and moist.     Eyes: Conjunctivae and EOM are normal. Pupils are equal, round, and reactive to light.  Neck: Normal range of motion. Neck supple. No thyromegaly present.  Cardiovascular: Normal rate, regular rhythm and intact distal pulses.  Exam reveals no gallop and no friction rub.   Murmur heard. Pulmonary/Chest: Effort normal and breath sounds normal.  Abdominal: Soft. Bowel sounds are normal. He exhibits no mass. There is no tenderness. There is no rebound and no guarding.  Genitourinary: Rectum normal and prostate normal.  Musculoskeletal: Normal range of motion. He exhibits no edema and no tenderness.  Lymphadenopathy:    He has no cervical adenopathy.  Neurological: He is oriented to person, place, and time. He has normal reflexes. No cranial nerve deficit. He exhibits normal muscle tone. Coordination normal.  Skin: No rash noted.  Psychiatric: He has a normal mood and affect. His behavior is normal. Judgment and thought content normal.   BP 158/94  Pulse 64  Temp(Src) 98.1 F (36.7 C) (Oral)  Resp 16  Ht 5' 5.5" (1.664 m)  Wt 193 lb 6.4 oz (87.726 kg)  BMI 31.68 kg/m2  SpO2 97% Note nl BPs in hosp      Assessment & Plan:  1. HTN (hypertension) - CBC with Differential - Comprehensive metabolic panel - lisinopril-hydrochlorothiazide (PRINZIDE,ZESTORETIC) 20-12.5 MG per tablet; Take 1 tablet by mouth daily.  Dispense: 90 tablet; Refill: 3  2. Hyperlipemia - Lipid panel - simvastatin (ZOCOR) 20 MG tablet; Take 1 tablet (20 mg total) by mouth every evening.  Dispense: 90 tablet; Refill: 3  3. Hyperglycemia - POCT glycosylated hemoglobin (Hb A1C)  4. Overweight (BMI 25.0-29.9)  5. Elevated serum creatinine at recent event - Comprehensive metabolic panel  6. Annual physical exam - PSA - IFOBT POC (occult bld, rslt in office)

## 2012-11-22 ENCOUNTER — Encounter: Payer: Self-pay | Admitting: Internal Medicine

## 2013-05-28 ENCOUNTER — Ambulatory Visit: Payer: PRIVATE HEALTH INSURANCE | Admitting: Internal Medicine

## 2013-06-04 ENCOUNTER — Ambulatory Visit (INDEPENDENT_AMBULATORY_CARE_PROVIDER_SITE_OTHER): Payer: PRIVATE HEALTH INSURANCE | Admitting: Internal Medicine

## 2013-06-04 ENCOUNTER — Encounter: Payer: Self-pay | Admitting: Internal Medicine

## 2013-06-04 ENCOUNTER — Other Ambulatory Visit: Payer: Self-pay | Admitting: Internal Medicine

## 2013-06-04 VITALS — BP 154/100 | HR 68 | Temp 97.9°F | Resp 16 | Ht 66.5 in | Wt 201.0 lb

## 2013-06-04 DIAGNOSIS — E785 Hyperlipidemia, unspecified: Secondary | ICD-10-CM

## 2013-06-04 DIAGNOSIS — I10 Essential (primary) hypertension: Secondary | ICD-10-CM

## 2013-06-04 LAB — COMPLETE METABOLIC PANEL WITH GFR
ALT: 56 U/L — ABNORMAL HIGH (ref 0–53)
AST: 42 U/L — ABNORMAL HIGH (ref 0–37)
Albumin: 4.8 g/dL (ref 3.5–5.2)
Alkaline Phosphatase: 60 U/L (ref 39–117)
BILIRUBIN TOTAL: 1.3 mg/dL — AB (ref 0.2–1.2)
BUN: 19 mg/dL (ref 6–23)
CO2: 27 meq/L (ref 19–32)
CREATININE: 1.17 mg/dL (ref 0.50–1.35)
Calcium: 10.2 mg/dL (ref 8.4–10.5)
Chloride: 106 mEq/L (ref 96–112)
GFR, EST AFRICAN AMERICAN: 72 mL/min
GFR, EST NON AFRICAN AMERICAN: 62 mL/min
GLUCOSE: 139 mg/dL — AB (ref 70–99)
Potassium: 4.8 mEq/L (ref 3.5–5.3)
Sodium: 141 mEq/L (ref 135–145)
Total Protein: 6.9 g/dL (ref 6.0–8.3)

## 2013-06-04 LAB — CBC WITH DIFFERENTIAL/PLATELET
Basophils Absolute: 0 10*3/uL (ref 0.0–0.1)
Basophils Relative: 0 % (ref 0–1)
EOS ABS: 0.2 10*3/uL (ref 0.0–0.7)
EOS PCT: 3 % (ref 0–5)
HCT: 43.9 % (ref 39.0–52.0)
HEMOGLOBIN: 15.5 g/dL (ref 13.0–17.0)
LYMPHS ABS: 2.4 10*3/uL (ref 0.7–4.0)
Lymphocytes Relative: 35 % (ref 12–46)
MCH: 31.3 pg (ref 26.0–34.0)
MCHC: 35.3 g/dL (ref 30.0–36.0)
MCV: 88.7 fL (ref 78.0–100.0)
MONO ABS: 0.9 10*3/uL (ref 0.1–1.0)
MONOS PCT: 13 % — AB (ref 3–12)
Neutro Abs: 3.2 10*3/uL (ref 1.7–7.7)
Neutrophils Relative %: 49 % (ref 43–77)
PLATELETS: 235 10*3/uL (ref 150–400)
RBC: 4.95 MIL/uL (ref 4.22–5.81)
RDW: 14.8 % (ref 11.5–15.5)
WBC: 6.7 10*3/uL (ref 4.0–10.5)

## 2013-06-04 LAB — LIPID PANEL
Cholesterol: 197 mg/dL (ref 0–200)
HDL: 46 mg/dL (ref >39)
LDL Cholesterol: 105 mg/dL — ABNORMAL HIGH (ref 0–99)
TRIGLYCERIDES: 228 mg/dL — AB (ref <150)
Total CHOL/HDL Ratio: 4.3 Ratio
VLDL: 46 mg/dL — ABNORMAL HIGH (ref 0–40)

## 2013-06-04 LAB — POCT GLYCOSYLATED HEMOGLOBIN (HGB A1C): Hemoglobin A1C: 6.4

## 2013-06-04 MED ORDER — SIMVASTATIN 20 MG PO TABS: 20.0000 mg | ORAL_TABLET | Freq: Every evening | ORAL | Status: DC

## 2013-06-04 MED ORDER — LISINOPRIL-HYDROCHLOROTHIAZIDE 20-12.5 MG PO TABS: 1.0000 | ORAL_TABLET | Freq: Every day | ORAL | Status: DC

## 2013-06-06 ENCOUNTER — Encounter: Payer: Self-pay | Admitting: Internal Medicine

## 2013-06-06 NOTE — Progress Notes (Signed)
Subjective:    Patient ID: Steven Paul, male    DOB: 1941/02/01, 73 y.o.   MRN: [id number]  HPIf/u Patient Active Problem List   Diagnosis Date Noted  . Overweight (BMI 25.0-29.9) 02/07/2012  . Hyponatremia 02/07/2012  . Expected blood loss anemia 02/07/2012  . S/P right THA, AA 02/06/2012  . HTN (hypertension) 08/02/2011  . Hyperlipemia 08/02/2011  . Hyperglycemia 08/02/2011   Problem stable Will schedule another hip replacement in February    Review of Systems Review of systems negative/13 systems    Objective:   Physical Exam  BP 154/100  Pulse 68  Temp(Src) 97.9 F (36.6 C)  Resp 16  Ht 5' 6.5" (1.689 m)  Wt 201 lb (91.173 kg)  BMI 31.96 kg/m2  SpO2 94% HEENT clear Blood pressure elevated as usual in the office while home blood pressures are controlled Heart regular without murmur Lungs clear No peripheral edema Neurological intact      Assessment & Plan:  HTN (hypertension) - Plan: lisinopril-hydrochlorothiazide (PRINZIDE,ZESTORETIC) 20-12.5 MG per tablet, POCT glycosylated hemoglobin (Hb A1C), CBC with Differential, COMPLETE METABOLIC PANEL WITH GFR  Hyperlipemia - Plan: simvastatin (ZOCOR) 20 MG tablet, Lipid panel  addendm-labs Results for orders placed in visit on 06/04/13  CBC WITH DIFFERENTIAL      Result Value Range   WBC 6.7  4.0 - 10.5 K/uL   RBC 4.95  4.22 - 5.81 MIL/uL   Hemoglobin 15.5  13.0 - 17.0 g/dL   HCT 59.5  39.6 - 72.8 %   MCV 88.7  78.0 - 100.0 fL   MCH 31.3  26.0 - 34.0 pg   MCHC 35.3  30.0 - 36.0 g/dL   RDW 97.9  15.0 - 41.3 %   Platelets 235  150 - 400 K/uL   Neutrophils Relative % 49  43 - 77 %   Neutro Abs 3.2  1.7 - 7.7 K/uL   Lymphocytes Relative 35  12 - 46 %   Lymphs Abs 2.4  0.7 - 4.0 K/uL   Monocytes Relative 13 (*) 3 - 12 %   Monocytes Absolute 0.9  0.1 - 1.0 K/uL   Eosinophils Relative 3  0 - 5 %   Eosinophils Absolute 0.2  0.0 - 0.7 K/uL   Basophils Relative 0  0 - 1 %   Basophils Absolute 0.0  0.0 -  0.1 K/uL   Smear Review Criteria for review not met    COMPLETE METABOLIC PANEL WITH GFR      Result Value Range   Sodium 141  135 - 145 mEq/L   Potassium 4.8  3.5 - 5.3 mEq/L   Chloride 106  96 - 112 mEq/L   CO2 27  19 - 32 mEq/L   Glucose, Bld 139 (*) 70 - 99 mg/dL   BUN 19  6 - 23 mg/dL   Creat 6.43  8.37 - 7.93 mg/dL   Total Bilirubin 1.3 (*) 0.2 - 1.2 mg/dL   Alkaline Phosphatase 60  39 - 117 U/L   AST 42 (*) 0 - 37 U/L   ALT 56 (*) 0 - 53 U/L   Total Protein 6.9  6.0 - 8.3 g/dL   Albumin 4.8  3.5 - 5.2 g/dL   Calcium 96.8  8.4 - 86.4 mg/dL   GFR, Est African American 72     GFR, Est Non African American 62    LIPID PANEL      Result Value Range   Cholesterol 197  0 -  200 mg/dL   Triglycerides 228 (*) <150 mg/dL   HDL 46  >39 mg/dL   Total CHOL/HDL Ratio 4.3     VLDL 46 (*) 0 - 40 mg/dL   LDL Cholesterol 105 (*) 0 - 99 mg/dL  POCT GLYCOSYLATED HEMOGLOBIN (HGB A1C)      Result Value Range   Hemoglobin A1C 6.4     Continues to be stable Meds ordered this encounter  Medications  . lisinopril-hydrochlorothiazide (PRINZIDE,ZESTORETIC) 20-12.5 MG per tablet    Sig: Take 1 tablet by mouth daily.    Dispense:  90 tablet    Refill:  3  . simvastatin (ZOCOR) 20 MG tablet    Sig: Take 1 tablet (20 mg total) by mouth every evening.    Dispense:  90 tablet    Refill:  3   Recheck 6 months Continue monitoring home blood pressures

## 2013-06-07 ENCOUNTER — Telehealth: Payer: Self-pay

## 2013-06-07 LAB — HEPATITIS C ANTIBODY: HCV AB: NEGATIVE

## 2013-06-07 NOTE — Telephone Encounter (Signed)
[facility] with [facility] at [facility]. Added a Hep C Panel per Dr. Merla Richesoolittle.

## 2013-06-20 ENCOUNTER — Encounter (HOSPITAL_COMMUNITY): Payer: Self-pay | Admitting: Pharmacy Technician

## 2013-06-24 ENCOUNTER — Inpatient Hospital Stay (HOSPITAL_COMMUNITY): Admission: RE | Admit: 2013-06-24 | Payer: PRIVATE HEALTH INSURANCE | Source: Ambulatory Visit

## 2013-06-25 ENCOUNTER — Ambulatory Visit (HOSPITAL_COMMUNITY)
Admission: RE | Admit: 2013-06-25 | Discharge: 2013-06-25 | Disposition: A | Discharge status: Home / Self Care | Payer: PRIVATE HEALTH INSURANCE | Source: Ambulatory Visit | Attending: Orthopedic Surgery | Admitting: Orthopedic Surgery

## 2013-06-25 ENCOUNTER — Encounter (HOSPITAL_COMMUNITY): Payer: Self-pay

## 2013-06-25 ENCOUNTER — Encounter (HOSPITAL_COMMUNITY)
Admission: RE | Admit: 2013-06-25 | Discharge: 2013-06-25 | Disposition: A | Discharge status: Home / Self Care | Payer: PRIVATE HEALTH INSURANCE | Source: Ambulatory Visit | Attending: Orthopedic Surgery | Admitting: Orthopedic Surgery

## 2013-06-25 DIAGNOSIS — G9589 Other specified diseases of spinal cord: Secondary | ICD-10-CM | POA: Insufficient documentation

## 2013-06-25 DIAGNOSIS — I771 Stricture of artery: Secondary | ICD-10-CM | POA: Insufficient documentation

## 2013-06-25 DIAGNOSIS — Z01812 Encounter for preprocedural laboratory examination: Secondary | ICD-10-CM | POA: Insufficient documentation

## 2013-06-25 DIAGNOSIS — Z01818 Encounter for other preprocedural examination: Secondary | ICD-10-CM | POA: Insufficient documentation

## 2013-06-25 HISTORY — DX: Unspecified osteoarthritis, unspecified site: M19.90

## 2013-06-25 LAB — CBC
HEMATOCRIT: 44.6 % (ref 39.0–52.0)
Hemoglobin: 15.2 g/dL (ref 13.0–17.0)
MCH: 31.1 pg (ref 26.0–34.0)
MCHC: 34.1 g/dL (ref 30.0–36.0)
MCV: 91.4 fL (ref 78.0–100.0)
Platelets: 207 10*3/uL (ref 150–400)
RBC: 4.88 MIL/uL (ref 4.22–5.81)
RDW: 13.9 % (ref 11.5–15.5)
WBC: 6.3 10*3/uL (ref 4.0–10.5)

## 2013-06-25 LAB — BASIC METABOLIC PANEL
BUN: 20 mg/dL (ref 6–23)
CHLORIDE: 103 meq/L (ref 96–112)
CO2: 27 mEq/L (ref 19–32)
Calcium: 10.2 mg/dL (ref 8.4–10.5)
Creatinine, Ser: 1.15 mg/dL (ref 0.50–1.35)
GFR calc Af Amer: 72 mL/min — ABNORMAL LOW (ref >90)
GFR calc non Af Amer: 62 mL/min — ABNORMAL LOW (ref >90)
Glucose, Bld: 144 mg/dL — ABNORMAL HIGH (ref 70–99)
POTASSIUM: 4.8 meq/L (ref 3.7–5.3)
Sodium: 142 mEq/L (ref 137–147)

## 2013-06-25 LAB — PROTIME-INR
INR: 0.97 (ref 0.00–1.49)
PROTHROMBIN TIME: 12.7 s (ref 11.6–15.2)

## 2013-06-25 LAB — URINALYSIS, ROUTINE W REFLEX MICROSCOPIC
BILIRUBIN URINE: NEGATIVE
GLUCOSE, UA: NEGATIVE mg/dL
Hgb urine dipstick: NEGATIVE
KETONES UR: NEGATIVE mg/dL
Leukocytes, UA: NEGATIVE
Nitrite: NEGATIVE
PROTEIN: NEGATIVE mg/dL
Specific Gravity, Urine: 1.014 (ref 1.005–1.030)
Urobilinogen, UA: 0.2 mg/dL (ref 0.0–1.0)
pH: 6.5 (ref 5.0–8.0)

## 2013-06-25 LAB — SURGICAL PCR SCREEN
MRSA, PCR: NEGATIVE
STAPHYLOCOCCUS AUREUS: NEGATIVE

## 2013-06-25 LAB — APTT: aPTT: 31 seconds (ref 24–37)

## 2013-06-25 NOTE — Pre-Procedure Instructions (Signed)
EKG REPORT IN EPIC FROM 11/16/12. PT'S B/P 180 /112 --RECHECKED ABOUT ONE HOUR LATER AND 176/100.  MEDICAL CLEARANCE FOR TOTAL HIP REVISION SURGERY ON PT' S CHART FROM DR. DOOLITTLE AND OFFICE NOTE 06/04/13 IN EPIC STATING B/P 154/100 AND THAT PT CHECKS HIS B/P'S AT HOME AND REPORTS HIS B/P NOT THAT HIGH.  PT IS ON B/P MEDICATION. [facility] D[facility].

## 2013-06-25 NOTE — Patient Instructions (Addendum)
   [facility] SURGERY IS SCHEDUL[facility] AT [facility]  ON:  Monday  2/23  REPORT TO  SHORT STAY CENTER AT:  10:00 AM      PHONE # FOR SHORT STAY IS [phone]  DO NOT EAT OR DRINK ANYTHING AFTER MIDNIGHT THE NIGHT BEFORE YOUR SURGERY.  YOU MAY BRUSH YOUR TEETH, RINSE OUT YOUR MOUTH--BUT NO WATER, NO FOOD, NO CHEWING GUM, NO MINTS, NO CANDIES, NO CHEWING TOBACCO.  PLEASE TAKE THE FOLLOWING MEDICATIONS THE AM OF YOUR SURGERY WITH A FEW SIPS OF WATER:    NO MEDS TO TAKE   DO NOT BRING VALUABLES, MONEY, CREDIT CARDS.  DO NOT WEAR JEWELRY, MAKE-UP, NAIL POLISH AND NO METAL PINS OR CLIPS IN YOUR HAIR. CONTACT LENS, DENTURES / PARTIALS, GLASSES SHOULD NOT BE WORN TO SURGERY AND IN MOST CASES-HEARING AIDS WILL NEED TO BE REMOVED.  BRING YOUR GLASSES CASE, ANY EQUIPMENT NEEDED FOR YOUR CONTACT LENS. FOR PATIENTS ADMITTED TO THE HOSPITAL--CHECK OUT TIME THE DAY OF DISCHARGE IS 11:00 AM.  ALL INPATIENT ROOMS ARE PRIVATE - WITH BATHROOM, TELEPHONE, TELEVISION AND WIFI INTERNET.                                                    PLEASE READ OVER ANY  FACT SHEETS THAT YOU WERE GIVEN: MRSA INFORMATION, BLOOD TRANSFUSION INFORMATION, INCENTIVE SPIROMETER INFORMATION.  FAILURE TO FOLLOW THESE INSTRUCTIONS MAY RESULT IN THE CANCELLATION OF YOUR SURGERY. PLEASE BE AWARE THAT YOU MAY NEED ADDITIONAL BLOOD DRAWN DAY OF YOUR SURGERY  PATIENT SIGNATURE_________________________________  PT NOTIFIED HIS SURGERY TIME WAS CHANGED TO 10:10 AM ON Monday  2/23 AND HE NEEDS TO ARRIVE TO SHORT STAY BY 7:45 AM.

## 2013-06-26 NOTE — H&P (Signed)
TOTAL HIP REVISION ADMISSION H&P  Patient is admitted for left revision total hip arthroplasty.  Subjective:  Chief Complaint: Left hip pain s/p left ASR hip replacement  HPI: Steven Paul, 73 y.o. 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 The indications for the revision total hip arthroplasty are bearing surface wear leading to  symptomatic synovitis.  Onset of symptoms was gradual starting 6-7 years ago with gradually worsening course since that time.  Prior procedures on the left hip include arthroplasty.  Patient currently rates pain in the left hip at 8 out of 10 with activity.  There is night pain, worsening of pain with activity and weight bearing, trendelenberg gait, pain that interfers with activities of daily living and pain with passive range of motion. 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  This condition presents safety issues increasing the risk of falls.  There is no current active infection.  Risks, benefits and expectations were discussed with the patient.  Risks including but not limited to the risk of anesthesia, blood clots, nerve damage, blood vessel damage, failure of the prosthesis, infection and up to and including death.  Patient understand the risks, benefits and expectations and wishes to proceed with surgery.   D/C Plans:   Home with HHPT  Post-op Meds:    No Rx given   Tranexamic Acid:   To be given  Decadron:    To be given  FYI:    ASA post-op  Norco post-op   Patient Active Problem List   Diagnosis Date Noted  . Overweight (BMI 25.0-29.9) 02/07/2012  . Hyponatremia 02/07/2012  . Expected blood loss anemia 02/07/2012  . S/P right THA, AA 02/06/2012  . HTN (hypertension) 08/02/2011  . Hyperlipemia 08/02/2011  . Hyperglycemia 08/02/2011   Past Medical History  Diagnosis Date  . Hypertension   . Hyperlipidemia   .  Internal hemorrhoids   . Pneumonia 05/2008  . Tinnitus     both ears all the time  . Arthritis     Past Surgical History  Procedure Laterality Date  . Tonsillectomy and adenoidectomy  as child  . 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 Total hip arthroplasty  02/06/2012    Procedure: TOTAL HIP ARTHROPLASTY ANTERIOR APPROACH;  Surgeon: Shelda PalMatthew D Olin, MD;  Location: [facility];  Service: Orthopedics;  Laterality: Right;    No prescriptions prior to admission   No Known Allergies   History  Substance Use Topics  . Smoking status: Never Smoker   . Smokeless tobacco: Never Used  . Alcohol Use: Yes     Comment: beer sometimes    Family History  Problem Relation Age of Onset  . Heart disease Mother   . Heart disease Father       Review of Systems  Constitutional: Negative.   HENT: Positive for hearing loss and tinnitus.   Eyes: Negative.   Respiratory: Negative.   Cardiovascular: Negative.   Gastrointestinal: Negative.   Musculoskeletal: Positive for joint pain.  Skin: Negative.   Neurological: Negative.   Endo/Heme/Allergies: Negative.   Psychiatric/Behavioral: Negative.     Objective:  Physical Exam  Constitutional: He is oriented to person, place, and time. He appears well-developed and well-nourished.  HENT:  Head: Normocephalic and atraumatic.  Mouth/Throat: Oropharynx is clear and moist.  Eyes: Pupils are equal, round, and reactive to light.  Neck: Neck  supple. No JVD present. No tracheal deviation present. No thyromegaly present.  Cardiovascular: Normal rate, regular rhythm, normal heart sounds and intact distal pulses.   Respiratory: Effort normal and breath sounds normal. No stridor. No respiratory distress. He has no wheezes.  GI: Soft. There is no tenderness. There is no guarding.  Musculoskeletal:       Left hip: He exhibits decreased range of motion, decreased strength, tenderness, bony tenderness and laceration (healed). He exhibits no swelling and no  deformity.  Lymphadenopathy:    He has no cervical adenopathy.  Neurological: He is alert and oriented to person, place, and time.  Skin: Skin is warm and dry.  Psychiatric: He has a normal mood and affect.     Labs:  Estimated body mass index is 31.96 kg/(m^2) as calculated from the following:   Height as of 06/04/13: 5' 6.5" (1.689 m).   Weight as of 06/04/13: 91.173 kg (201 lb).  Imaging Review:  Plain radiographs demonstrate previous ASR total hip arthroplasty of the left hip(s). The bone quality appears to be good for age and reported activity level.   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   The patient history, physical examination, clinical judgement of the provider and imaging studies are consistent with end stage degenerative joint disease of the left hip(s), previous total hip arthroplasty. Revision total hip arthroplasty is deemed medically necessary. The treatment options including medical management, injection therapy, arthroscopy and arthroplasty were discussed at length. The risks and benefits of total hip arthroplasty were presented and reviewed. The risks due to aseptic loosening, infection, stiffness, dislocation/subluxation,  thromboembolic complications and other imponderables were discussed.  The patient acknowledged the explanation, agreed to proceed with the plan and consent was signed. Patient is being admitted for inpatient treatment for surgery, pain control, PT, OT, prophylactic antibiotics, VTE prophylaxis, progressive ambulation and ADL's and discharge planning. The patient is planning to be discharged home with home health services .      Steven Paul Steven Paul   PAC  06/26/2013, 10:22 AM

## 2013-06-30 ENCOUNTER — Inpatient Hospital Stay (HOSPITAL_COMMUNITY)
Admission: RE | Admit: 2013-06-30 | Discharge: 2013-07-01 | DRG: 467 | Disposition: A | Discharge status: Home Health | Payer: PRIVATE HEALTH INSURANCE | Source: Ambulatory Visit | Attending: Orthopedic Surgery | Admitting: Orthopedic Surgery

## 2013-06-30 ENCOUNTER — Encounter (HOSPITAL_COMMUNITY): Payer: Self-pay

## 2013-06-30 ENCOUNTER — Encounter (HOSPITAL_COMMUNITY)
Admission: RE | Disposition: A | Discharge status: Home Health | Payer: Self-pay | Source: Ambulatory Visit | Attending: Orthopedic Surgery

## 2013-06-30 ENCOUNTER — Inpatient Hospital Stay (HOSPITAL_COMMUNITY): Payer: PRIVATE HEALTH INSURANCE

## 2013-06-30 ENCOUNTER — Encounter (HOSPITAL_COMMUNITY): Payer: PRIVATE HEALTH INSURANCE | Admitting: Anesthesiology

## 2013-06-30 ENCOUNTER — Inpatient Hospital Stay (HOSPITAL_COMMUNITY): Payer: PRIVATE HEALTH INSURANCE | Admitting: Anesthesiology

## 2013-06-30 DIAGNOSIS — D62 Acute posthemorrhagic anemia: Secondary | ICD-10-CM | POA: Diagnosis not present

## 2013-06-30 DIAGNOSIS — R7989 Other specified abnormal findings of blood chemistry: Secondary | ICD-10-CM | POA: Diagnosis present

## 2013-06-30 DIAGNOSIS — M259 Joint disorder, unspecified: Secondary | ICD-10-CM | POA: Diagnosis not present

## 2013-06-30 DIAGNOSIS — E669 Obesity, unspecified: Secondary | ICD-10-CM | POA: Diagnosis present

## 2013-06-30 DIAGNOSIS — Z6831 Body mass index (BMI) 31.0-31.9, adult: Secondary | ICD-10-CM

## 2013-06-30 DIAGNOSIS — I10 Essential (primary) hypertension: Secondary | ICD-10-CM | POA: Diagnosis present

## 2013-06-30 DIAGNOSIS — Z96649 Presence of unspecified artificial hip joint: Secondary | ICD-10-CM | POA: Diagnosis not present

## 2013-06-30 DIAGNOSIS — T84099A Other mechanical complication of unspecified internal joint prosthesis, initial encounter: Secondary | ICD-10-CM | POA: Diagnosis present

## 2013-06-30 DIAGNOSIS — D500 Iron deficiency anemia secondary to blood loss (chronic): Secondary | ICD-10-CM | POA: Diagnosis present

## 2013-06-30 DIAGNOSIS — M25559 Pain in unspecified hip: Secondary | ICD-10-CM | POA: Diagnosis not present

## 2013-06-30 DIAGNOSIS — Y831 Surgical operation with implant of artificial internal device as the cause of abnormal reaction of the patient, or of later complication, without mention of misadventure at the time of the procedure: Secondary | ICD-10-CM | POA: Diagnosis present

## 2013-06-30 HISTORY — PX: TOTAL HIP REVISION: SHX763

## 2013-06-30 LAB — TYPE AND SCREEN
ABO/RH(D): O POS
Antibody Screen: NEGATIVE

## 2013-06-30 SURGERY — TOTAL HIP REVISION
Anesthesia: General | Site: Hip | Laterality: Left

## 2013-06-30 MED ORDER — PROPOFOL 10 MG/ML IV BOLUS
INTRAVENOUS | Status: DC | PRN
  Administered 2013-06-30: 200 mg via INTRAVENOUS

## 2013-06-30 MED ORDER — SODIUM CHLORIDE 0.9 % IV SOLN
100.0000 mL/h | INTRAVENOUS | Status: DC
  Administered 2013-06-30: 100 mL/h via INTRAVENOUS
  Filled 2013-06-30 (×5): qty 1000

## 2013-06-30 MED ORDER — DEXAMETHASONE SODIUM PHOSPHATE 10 MG/ML IJ SOLN
INTRAMUSCULAR | Status: DC | PRN
  Administered 2013-06-30: 10 mg via INTRAVENOUS

## 2013-06-30 MED ORDER — ONDANSETRON HCL 4 MG PO TABS
4.0000 mg | ORAL_TABLET | Freq: Four times a day (QID) | ORAL | Status: DC | PRN
  Administered 2013-07-01: 4 mg via ORAL
  Filled 2013-06-30: qty 1

## 2013-06-30 MED ORDER — ONDANSETRON HCL 4 MG/2ML IJ SOLN
INTRAMUSCULAR | Status: AC
  Filled 2013-06-30: qty 2

## 2013-06-30 MED ORDER — METHOCARBAMOL 500 MG PO TABS
500.0000 mg | ORAL_TABLET | Freq: Four times a day (QID) | ORAL | Status: DC | PRN
  Administered 2013-06-30 – 2013-07-01 (×2): 500 mg via ORAL
  Filled 2013-06-30 (×2): qty 1

## 2013-06-30 MED ORDER — CISATRACURIUM BESYLATE 20 MG/10ML IV SOLN
INTRAVENOUS | Status: AC
  Filled 2013-06-30: qty 10

## 2013-06-30 MED ORDER — PROPOFOL 10 MG/ML IV BOLUS
INTRAVENOUS | Status: AC
Start: 2013-06-30 — End: 2013-06-30
  Filled 2013-06-30: qty 20

## 2013-06-30 MED ORDER — MENTHOL 3 MG MT LOZG
1.0000 | LOZENGE | OROMUCOSAL | Status: DC | PRN
  Filled 2013-06-30: qty 9

## 2013-06-30 MED ORDER — DIPHENHYDRAMINE HCL 25 MG PO CAPS: 25.0000 mg | ORAL_CAPSULE | Freq: Four times a day (QID) | ORAL | Status: DC | PRN

## 2013-06-30 MED ORDER — DEXAMETHASONE SODIUM PHOSPHATE 10 MG/ML IJ SOLN: 10.0000 mg | Freq: Once | INTRAMUSCULAR | Status: DC

## 2013-06-30 MED ORDER — CHLORHEXIDINE GLUCONATE 4 % EX LIQD: 60.0000 mL | Freq: Once | CUTANEOUS | Status: DC

## 2013-06-30 MED ORDER — PROMETHAZINE HCL 25 MG/ML IJ SOLN: 6.2500 mg | INTRAMUSCULAR | Status: DC | PRN

## 2013-06-30 MED ORDER — METOCLOPRAMIDE HCL 10 MG PO TABS: 5.0000 mg | ORAL_TABLET | Freq: Three times a day (TID) | ORAL | Status: DC | PRN

## 2013-06-30 MED ORDER — FENTANYL CITRATE 0.05 MG/ML IJ SOLN
INTRAMUSCULAR | Status: AC
  Filled 2013-06-30: qty 5

## 2013-06-30 MED ORDER — ALUM & MAG HYDROXIDE-SIMETH 200-200-20 MG/5ML PO SUSP: 30.0000 mL | ORAL | Status: DC | PRN

## 2013-06-30 MED ORDER — DOCUSATE SODIUM 100 MG PO CAPS
100.0000 mg | ORAL_CAPSULE | Freq: Two times a day (BID) | ORAL | Status: DC
Start: 2013-06-30 — End: 2013-07-01
  Administered 2013-06-30 – 2013-07-01 (×2): 100 mg via ORAL

## 2013-06-30 MED ORDER — FERROUS SULFATE 325 (65 FE) MG PO TABS
325.0000 mg | ORAL_TABLET | Freq: Three times a day (TID) | ORAL | Status: DC
  Administered 2013-07-01: 325 mg via ORAL
  Filled 2013-06-30 (×4): qty 1

## 2013-06-30 MED ORDER — HYDROCODONE-ACETAMINOPHEN 7.5-325 MG PO TABS
1.0000 | ORAL_TABLET | ORAL | Status: DC
  Administered 2013-06-30 – 2013-07-01 (×4): 2 via ORAL
  Filled 2013-06-30 (×4): qty 2

## 2013-06-30 MED ORDER — SUCCINYLCHOLINE CHLORIDE 20 MG/ML IJ SOLN
INTRAMUSCULAR | Status: DC | PRN
  Administered 2013-06-30: 100 mg via INTRAVENOUS

## 2013-06-30 MED ORDER — FENTANYL CITRATE 0.05 MG/ML IJ SOLN
INTRAMUSCULAR | Status: DC | PRN
  Administered 2013-06-30: 50 ug via INTRAVENOUS
  Administered 2013-06-30: 150 ug via INTRAVENOUS

## 2013-06-30 MED ORDER — CEFAZOLIN SODIUM-DEXTROSE 2-3 GM-% IV SOLR
2.0000 g | Freq: Four times a day (QID) | INTRAVENOUS | Status: AC
  Administered 2013-06-30 (×2): 2 g via INTRAVENOUS
  Filled 2013-06-30 (×2): qty 50

## 2013-06-30 MED ORDER — CELECOXIB 200 MG PO CAPS
200.0000 mg | ORAL_CAPSULE | Freq: Two times a day (BID) | ORAL | Status: DC
  Administered 2013-06-30 – 2013-07-01 (×2): 200 mg via ORAL
  Filled 2013-06-30 (×3): qty 1

## 2013-06-30 MED ORDER — BISACODYL 10 MG RE SUPP: 10.0000 mg | Freq: Every day | RECTAL | Status: DC | PRN

## 2013-06-30 MED ORDER — POLYETHYLENE GLYCOL 3350 17 G PO PACK: 17.0000 g | PACK | Freq: Two times a day (BID) | ORAL | Status: DC

## 2013-06-30 MED ORDER — HYDROMORPHONE HCL PF 1 MG/ML IJ SOLN
0.2500 mg | INTRAMUSCULAR | Status: DC | PRN
  Administered 2013-06-30: 0.5 mg via INTRAVENOUS

## 2013-06-30 MED ORDER — ASPIRIN EC 325 MG PO TBEC
325.0000 mg | DELAYED_RELEASE_TABLET | Freq: Two times a day (BID) | ORAL | Status: DC
  Administered 2013-07-01: 325 mg via ORAL
  Filled 2013-06-30 (×3): qty 1

## 2013-06-30 MED ORDER — DEXAMETHASONE SODIUM PHOSPHATE 10 MG/ML IJ SOLN
INTRAMUSCULAR | Status: AC
  Filled 2013-06-30: qty 1

## 2013-06-30 MED ORDER — PHENOL 1.4 % MT LIQD: 1.0000 | OROMUCOSAL | Status: DC | PRN

## 2013-06-30 MED ORDER — CEFAZOLIN SODIUM-DEXTROSE 2-3 GM-% IV SOLR
INTRAVENOUS | Status: AC
  Filled 2013-06-30: qty 50

## 2013-06-30 MED ORDER — DEXAMETHASONE SODIUM PHOSPHATE 10 MG/ML IJ SOLN
10.0000 mg | Freq: Once | INTRAMUSCULAR | Status: DC
  Filled 2013-06-30: qty 1

## 2013-06-30 MED ORDER — LACTATED RINGERS IV SOLN
INTRAVENOUS | Status: DC | PRN
  Administered 2013-06-30 (×3): via INTRAVENOUS

## 2013-06-30 MED ORDER — HYDRALAZINE HCL 20 MG/ML IJ SOLN
INTRAMUSCULAR | Status: AC
  Filled 2013-06-30: qty 1

## 2013-06-30 MED ORDER — METOCLOPRAMIDE HCL 5 MG/ML IJ SOLN: 5.0000 mg | Freq: Three times a day (TID) | INTRAMUSCULAR | Status: DC | PRN

## 2013-06-30 MED ORDER — ACETAMINOPHEN 10 MG/ML IV SOLN
1000.0000 mg | Freq: Once | INTRAVENOUS | Status: AC
  Administered 2013-06-30: 1000 mg via INTRAVENOUS
  Filled 2013-06-30: qty 100

## 2013-06-30 MED ORDER — ONDANSETRON HCL 4 MG/2ML IJ SOLN: 4.0000 mg | Freq: Four times a day (QID) | INTRAMUSCULAR | Status: DC | PRN

## 2013-06-30 MED ORDER — HYDROMORPHONE HCL PF 1 MG/ML IJ SOLN
INTRAMUSCULAR | Status: AC
  Filled 2013-06-30: qty 1

## 2013-06-30 MED ORDER — 0.9 % SODIUM CHLORIDE (POUR BTL) OPTIME
TOPICAL | Status: DC | PRN
  Administered 2013-06-30: 1000 mL

## 2013-06-30 MED ORDER — HYDROMORPHONE HCL PF 1 MG/ML IJ SOLN: 0.5000 mg | INTRAMUSCULAR | Status: DC | PRN

## 2013-06-30 MED ORDER — LACTATED RINGERS IV SOLN: INTRAVENOUS | Status: DC

## 2013-06-30 MED ORDER — EPHEDRINE SULFATE 50 MG/ML IJ SOLN
INTRAMUSCULAR | Status: DC | PRN
Start: 2013-06-30 — End: 2013-06-30
  Administered 2013-06-30: 5 mg via INTRAVENOUS
  Administered 2013-06-30: 10 mg via INTRAVENOUS
  Administered 2013-06-30: 5 mg via INTRAVENOUS

## 2013-06-30 MED ORDER — HYDRALAZINE HCL 20 MG/ML IJ SOLN
5.0000 mg | Freq: Once | INTRAMUSCULAR | Status: AC
  Administered 2013-06-30: 5 mg via INTRAVENOUS

## 2013-06-30 MED ORDER — CEFAZOLIN SODIUM-DEXTROSE 2-3 GM-% IV SOLR
2.0000 g | INTRAVENOUS | Status: DC
  Administered 2013-06-30: 2 g via INTRAVENOUS

## 2013-06-30 MED ORDER — CISATRACURIUM BESYLATE (PF) 10 MG/5ML IV SOLN
INTRAVENOUS | Status: DC | PRN
  Administered 2013-06-30: 8 mg via INTRAVENOUS

## 2013-06-30 MED ORDER — FLEET ENEMA 7-19 GM/118ML RE ENEM: 1.0000 | ENEMA | Freq: Once | RECTAL | Status: AC | PRN

## 2013-06-30 MED ORDER — SIMVASTATIN 20 MG PO TABS
20.0000 mg | ORAL_TABLET | Freq: Every evening | ORAL | Status: DC
  Filled 2013-06-30 (×2): qty 1

## 2013-06-30 MED ORDER — METHOCARBAMOL 100 MG/ML IJ SOLN
500.0000 mg | Freq: Four times a day (QID) | INTRAVENOUS | Status: DC | PRN
  Administered 2013-06-30: 500 mg via INTRAVENOUS
  Filled 2013-06-30: qty 5

## 2013-06-30 MED ORDER — TRANEXAMIC ACID 100 MG/ML IV SOLN
1000.0000 mg | Freq: Once | INTRAVENOUS | Status: DC
  Administered 2013-06-30: 1000 mg via INTRAVENOUS
  Filled 2013-06-30: qty 10

## 2013-06-30 SURGICAL SUPPLY — 64 items
ACETAB CUP W GRIPTION 54MM (Plate) ×1 IMPLANT
ACETAB CUP W/GRIPTION 54 (Plate) ×2 IMPLANT
ADH SKN CLS APL DERMABOND .7 (GAUZE/BANDAGES/DRESSINGS) ×1
BAG SPEC THK2 15X12 ZIP CLS (MISCELLANEOUS) ×1
BAG ZIPLOCK 12X15 (MISCELLANEOUS) ×3 IMPLANT
BLADE SAW SGTL 18X1.27X75 (BLADE) ×2 IMPLANT
BLADE SAW SGTL 18X1.27X75MM (BLADE) ×1
BRUSH FEMORAL CANAL (MISCELLANEOUS) IMPLANT
CAPT HIP PF COP ×2 IMPLANT
CUP ACETAB W/GRIPTION 54 (Plate) IMPLANT
DERMABOND ADVANCED (GAUZE/BANDAGES/DRESSINGS) ×2
DERMABOND ADVANCED .7 DNX12 (GAUZE/BANDAGES/DRESSINGS) ×1 IMPLANT
DRAPE INCISE IOBAN 85X60 (DRAPES) ×3 IMPLANT
DRAPE ORTHO SPLIT 77X108 STRL (DRAPES) ×6
DRAPE POUCH INSTRU U-SHP 10X18 (DRAPES) ×3 IMPLANT
DRAPE SURG 17X11 SM STRL (DRAPES) ×3 IMPLANT
DRAPE SURG ORHT 6 SPLT 77X108 (DRAPES) ×2 IMPLANT
DRAPE U-SHAPE 47X51 STRL (DRAPES) ×3 IMPLANT
DRSG AQUACEL AG ADV 3.5X10 (GAUZE/BANDAGES/DRESSINGS) ×2 IMPLANT
DRSG AQUACEL AG ADV 3.5X14 (GAUZE/BANDAGES/DRESSINGS) ×1 IMPLANT
DRSG EMULSION OIL 3X16 NADH (GAUZE/BANDAGES/DRESSINGS) ×1 IMPLANT
DRSG MEPILEX BORDER 4X4 (GAUZE/BANDAGES/DRESSINGS) ×1 IMPLANT
DRSG MEPILEX BORDER 4X8 (GAUZE/BANDAGES/DRESSINGS) ×1 IMPLANT
DRSG TEGADERM 4X4.75 (GAUZE/BANDAGES/DRESSINGS) ×1 IMPLANT
DURAPREP 26ML APPLICATOR (WOUND CARE) ×3 IMPLANT
ELECT BLADE TIP CTD 4 INCH (ELECTRODE) ×3 IMPLANT
ELECT REM PT RETURN 9FT ADLT (ELECTROSURGICAL) ×3
ELECTRODE REM PT RTRN 9FT ADLT (ELECTROSURGICAL) ×1 IMPLANT
ELIMINATOR HOLE APEX DEPUY (Hips) ×2 IMPLANT
EVACUATOR 1/8 PVC DRAIN (DRAIN) ×3 IMPLANT
FACESHIELD LNG OPTICON STERILE (SAFETY) ×12 IMPLANT
GAUZE SPONGE 2X2 8PLY STRL LF (GAUZE/BANDAGES/DRESSINGS) ×1 IMPLANT
GLOVE BIOGEL PI IND STRL 7.5 (GLOVE) ×1 IMPLANT
GLOVE BIOGEL PI IND STRL 8 (GLOVE) ×1 IMPLANT
GLOVE BIOGEL PI INDICATOR 7.5 (GLOVE) ×2
GLOVE BIOGEL PI INDICATOR 8 (GLOVE) ×2
GLOVE ECLIPSE 8.0 STRL XLNG CF (GLOVE) ×6 IMPLANT
GOWN SPEC L3 XXLG W/TWL (GOWN DISPOSABLE) ×6 IMPLANT
GOWN STRL REUS W/TWL LRG LVL3 (GOWN DISPOSABLE) ×3 IMPLANT
HANDPIECE INTERPULSE COAX TIP (DISPOSABLE)
HEAD CERAMIC 36 PLUS5 (Hips) ×2 IMPLANT
KIT BASIN OR (CUSTOM PROCEDURE TRAY) ×3 IMPLANT
LINER NEUTRAL 54X36MM PLUS 4 (Hips) ×2 IMPLANT
MANIFOLD NEPTUNE II (INSTRUMENTS) ×3 IMPLANT
NS IRRIG 1000ML POUR BTL (IV SOLUTION) ×6 IMPLANT
PACK TOTAL JOINT (CUSTOM PROCEDURE TRAY) ×3 IMPLANT
POSITIONER SURGICAL ARM (MISCELLANEOUS) ×3 IMPLANT
PRESSURIZER FEMORAL UNIV (MISCELLANEOUS) IMPLANT
SCREW 6.5MMX35MM (Screw) ×2 IMPLANT
SET HNDPC FAN SPRY TIP SCT (DISPOSABLE) IMPLANT
SPONGE GAUZE 2X2 STER 10/PKG (GAUZE/BANDAGES/DRESSINGS)
SPONGE LAP 18X18 X RAY DECT (DISPOSABLE) ×3 IMPLANT
SPONGE LAP 4X18 X RAY DECT (DISPOSABLE) ×3 IMPLANT
STAPLER VISISTAT 35W (STAPLE) ×1 IMPLANT
SUCTION FRAZIER TIP 10 FR DISP (SUCTIONS) ×3 IMPLANT
SUT VIC AB 1 CT1 36 (SUTURE) ×9 IMPLANT
SUT VIC AB 2-0 CT1 27 (SUTURE) ×9
SUT VIC AB 2-0 CT1 TAPERPNT 27 (SUTURE) ×3 IMPLANT
SUT VLOC 180 0 24IN GS25 (SUTURE) ×4 IMPLANT
TOWEL OR 17X26 10 PK STRL BLUE (TOWEL DISPOSABLE) ×6 IMPLANT
TOWER CARTRIDGE SMART MIX (DISPOSABLE) IMPLANT
TRAY FOLEY CATH 14FRSI W/METER (CATHETERS) ×1 IMPLANT
TRAY FOLEY METER SIL LF 16FR (CATHETERS) ×2 IMPLANT
WATER STERILE IRR 1500ML POUR (IV SOLUTION) ×5 IMPLANT

## 2013-06-30 NOTE — Evaluation (Signed)
Physical Therapy Evaluation Patient Details Name: Steven Paul MRN: [id number] DOB: [birth year] Today's Date: 06/30/2013 Time: [id number] PT Time Calculation (min): 29 min  PT Assessment / Plan / Recommendation History of Present Illness  L  posterior THA   Clinical Impression  Pt will benefit from PT to address deficits below    PT Assessment  Patient needs continued PT services    Follow Up Recommendations  Home health PT    Does the patient have the potential to tolerate intense rehabilitation      Barriers to Discharge        Equipment Recommendations  None recommended by PT    Recommendations for Other Services     Frequency 7X/week    Precautions / Restrictions Precautions Precautions: Posterior Hip Restrictions Other Position/Activity Restrictions: WBAT   Pertinent Vitals/Pain VSS, pain controlled      Mobility  Bed Mobility Overal bed mobility: Needs Assistance Bed Mobility: Supine to Sit Supine to sit: Min guard General bed mobility comments: cues for THP Transfers Overall transfer level: Needs assistance Equipment used: Rolling walker (2 wheeled) Transfers: Sit to/from Stand General transfer comment: cues for hand placement,  and THP Ambulation/Gait Ambulation/Gait assistance: Min guard Ambulation Distance (Feet): 180 Feet Assistive device: Rolling walker (2 wheeled) Gait Pattern/deviations: Step-through pattern General Gait Details: cues for step through     Exercises Total Joint Exercises Ankle Circles/Pumps: AROM;Both;5 reps   PT Diagnosis: Difficulty walking  PT Problem List: Decreased activity tolerance;Decreased balance;Decreased mobility;Decreased knowledge of use of DME;Decreased knowledge of precautions PT Treatment Interventions: DME instruction;Gait training;Functional mobility training;Therapeutic activities;Therapeutic exercise;Patient/family education;Stair training     PT Goals(Current goals can be found in the care plan  section) Acute Rehab PT Goals Patient Stated Goal: I home soon PT Goal Formulation: With patient Time For Goal Achievement: 07/07/13 Potential to Achieve Goals: Good  Visit Information  Last PT Received On: 06/30/13 Assistance Needed: +1 History of Present Illness: L  posterior THA        Prior Functioning  Home Living Family/patient expects to be discharged to:: Private residence Living Arrangements: Other (Comment);Spouse/significant other Available Help at Discharge: Family Type of Home: House Home Access: Stairs to enter Entergy CorporationEntrance Stairs-Number of Steps: 4 Entrance Stairs-Rails: None Home Layout: One level Home Equipment: Environmental consultantWalker - 2 wheels;Cane - single point Prior Function Level of Independence: Independent Communication Communication: No difficulties    Cognition  Cognition Arousal/Alertness: Awake/alert Behavior During Therapy: WFL for tasks assessed/performed Overall Cognitive Status: Within Functional Limits for tasks assessed    Extremity/Trunk Assessment Lower Extremity Assessment Lower Extremity Assessment: LLE deficits/detail LLE Deficits / Details: AAROM WFL pt able to move against gravity   Balance    End of Session PT - End of Session Equipment Utilized During Treatment: Gait belt Activity Tolerance: Patient tolerated treatment well Patient left: in chair;with call bell/phone within reach;with family/visitor present Nurse Communication: Mobility status  GP     [facility],Steven Jenkinson 06/30/2013, 5:36 PM

## 2013-06-30 NOTE — Anesthesia Postprocedure Evaluation (Signed)
  Anesthesia Post-op Note  Patient: Steven Paul  Procedure(s) Performed: Procedure(s) (LRB): LEFT TOTAL HIP REVISION (Left)  Patient Location: PACU  Anesthesia Type: General  Level of Consciousness: awake and alert   Airway and Oxygen Therapy: Patient Spontanous Breathing  Post-op Pain: mild  Post-op Assessment: Post-op Vital signs reviewed, Patient's Cardiovascular Status Stable, Respiratory Function Stable, Patent Airway and No signs of Nausea or vomiting  Last Vitals:  Filed Vitals:   06/30/13 0833  BP: 161/96  Pulse:   Temp:   Resp:     Post-op Vital Signs: stable   Complications: No apparent anesthesia complications

## 2013-06-30 NOTE — Progress Notes (Signed)
Utilization review completed.  

## 2013-06-30 NOTE — Brief Op Note (Signed)
06/30/2013  12:15 PM  PATIENT:  Steven Paul  73 y.o. male  PRE-OPERATIVE DIAGNOSIS:  FAILED LEFT TOTAL HIP replacement, metallosis  POST-OPERATIVE DIAGNOSIS:  FAILED LEFT TOTAL HIP replacement, metallosis  PROCEDURE:  Procedure(s): LEFT TOTAL HIP REVISION (Left)  SURGEON:  Surgeon(s) and Role:    * Shelda PalMatthew D Fayette Gasner, MD - Primary  PHYSICIAN ASSISTANT: Lanney GinsMatthew Babish, PA-C  ANESTHESIA:   general  EBL:  Total I/O In: 2000 [I.V.:2000] Out: 500 [Urine:100; Blood:400]  BLOOD ADMINISTERED:none  DRAINS: none   LOCAL MEDICATIONS USED:  NONE  SPECIMEN:  Source of Specimen:  Left hip components per Attorney  DISPOSITION OF SPECIMEN:  PATHOLOGY  COUNTS:  YES  TOURNIQUET:  * No tourniquets in log *  DICTATION: .Other Dictation: Dictation Number [id number]  PLAN OF CARE: Admit to inpatient   PATIENT DISPOSITION:  PACU - hemodynamically stable.   Delay start of Pharmacological VTE agent (>24hrs) due to surgical blood loss or risk of bleeding: no

## 2013-06-30 NOTE — Transfer of Care (Signed)
Immediate Anesthesia Transfer of Care Note  Patient: Steven RiffleJames H Jian  Procedure(s) Performed: Procedure(s): LEFT TOTAL HIP REVISION (Left)  Patient Location: PACU  Anesthesia Type:General  Level of Consciousness: awake, alert , sedated and patient cooperative  Airway & Oxygen Therapy: Patient Spontanous Breathing and Patient connected to face mask oxygen  Post-op Assessment: Report given to PACU RN and Post -op Vital signs reviewed and stable  Post vital signs: Reviewed and stable  Complications: No apparent anesthesia complications

## 2013-06-30 NOTE — Anesthesia Preprocedure Evaluation (Addendum)
Anesthesia Evaluation  Patient identified by MRN, date of birth, ID band Patient awake    Reviewed: Allergy & Precautions, H&P , NPO status , Patient's Chart, lab work & pertinent test results  Airway Mallampati: II TM Distance: >3 FB Neck ROM: Full    Dental  (+) Edentulous Upper, Edentulous Lower, Dental Advisory Given   Pulmonary neg pulmonary ROS,  breath sounds clear to auscultation  Pulmonary exam normal       Cardiovascular hypertension, Pt. on medications Rhythm:Regular Rate:Normal     Neuro/Psych negative neurological ROS  negative psych ROS   GI/Hepatic negative GI ROS, Neg liver ROS,   Endo/Other  negative endocrine ROS  Renal/GU negative Renal ROS  negative genitourinary   Musculoskeletal negative musculoskeletal ROS (+)   Abdominal   Peds negative pediatric ROS (+)  Hematology negative hematology ROS (+)   Anesthesia Other Findings   Reproductive/Obstetrics negative OB ROS                          Anesthesia Physical Anesthesia Plan  ASA: II  Anesthesia Plan: General   Post-op Pain Management:    Induction: Intravenous  Airway Management Planned: Oral ETT  Additional Equipment:   Intra-op Plan:   Post-operative Plan: Extubation in OR  Informed Consent: I have reviewed the patients History and Physical, chart, labs and discussed the procedure including the risks, benefits and alternatives for the proposed anesthesia with the patient or authorized representative who has indicated his/her understanding and acceptance.   Dental advisory given  Plan Discussed with: CRNA and Surgeon  Anesthesia Plan Comments:         Anesthesia Quick Evaluation

## 2013-06-30 NOTE — Interval H&P Note (Signed)
History and Physical Interval Note:  06/30/2013 9:59 AM  Pricilla RiffleJames H Jech  has presented today for surgery, with the diagnosis of FAILED LEFT TOTAL HIP  The various methods of treatment have been discussed with the patient and family. After consideration of risks, benefits and other options for treatment, the patient has consented to  Procedure(s): LEFT TOTAL HIP REVISION (Left) as a surgical intervention .  The patient's history has been reviewed, patient examined, no change in status, stable for surgery.  I have reviewed the patient's chart and labs.  Questions were answered to the patient's satisfaction.     Shelda PalLIN,Bernadette Armijo D

## 2013-07-01 DIAGNOSIS — E669 Obesity, unspecified: Secondary | ICD-10-CM | POA: Diagnosis present

## 2013-07-01 LAB — CBC
HEMATOCRIT: 37.7 % — AB (ref 39.0–52.0)
HEMOGLOBIN: 12.9 g/dL — AB (ref 13.0–17.0)
MCH: 31.3 pg (ref 26.0–34.0)
MCHC: 34.2 g/dL (ref 30.0–36.0)
MCV: 91.5 fL (ref 78.0–100.0)
Platelets: 204 10*3/uL (ref 150–400)
RBC: 4.12 MIL/uL — AB (ref 4.22–5.81)
RDW: 14.3 % (ref 11.5–15.5)
WBC: 12.7 10*3/uL — ABNORMAL HIGH (ref 4.0–10.5)

## 2013-07-01 LAB — BASIC METABOLIC PANEL
BUN: 20 mg/dL (ref 6–23)
CALCIUM: 8.9 mg/dL (ref 8.4–10.5)
CO2: 23 meq/L (ref 19–32)
CREATININE: 1.17 mg/dL (ref 0.50–1.35)
Chloride: 99 mEq/L (ref 96–112)
GFR calc Af Amer: 71 mL/min — ABNORMAL LOW (ref >90)
GFR, EST NON AFRICAN AMERICAN: 61 mL/min — AB (ref >90)
GLUCOSE: 190 mg/dL — AB (ref 70–99)
Potassium: 4.8 mEq/L (ref 3.7–5.3)
Sodium: 137 mEq/L (ref 137–147)

## 2013-07-01 MED ORDER — TIZANIDINE HCL 4 MG PO CAPS: 4.0000 mg | ORAL_CAPSULE | Freq: Three times a day (TID) | ORAL | Status: DC | PRN

## 2013-07-01 MED ORDER — HYDROCODONE-ACETAMINOPHEN 7.5-325 MG PO TABS: 1.0000 | ORAL_TABLET | ORAL | Status: DC | PRN

## 2013-07-01 MED ORDER — FERROUS SULFATE 325 (65 FE) MG PO TABS: 325.0000 mg | ORAL_TABLET | Freq: Three times a day (TID) | ORAL | Status: DC

## 2013-07-01 MED ORDER — ASPIRIN 325 MG PO TBEC: 325.0000 mg | DELAYED_RELEASE_TABLET | Freq: Two times a day (BID) | ORAL | Status: AC

## 2013-07-01 MED ORDER — DSS 100 MG PO CAPS: 100.0000 mg | ORAL_CAPSULE | Freq: Two times a day (BID) | ORAL | Status: DC

## 2013-07-01 MED ORDER — POLYETHYLENE GLYCOL 3350 17 G PO PACK: 17.0000 g | PACK | Freq: Two times a day (BID) | ORAL | Status: DC

## 2013-07-01 NOTE — Progress Notes (Signed)
   CARE MANAGEMENT NOTE 07/01/2013  Patient:  Steven Paul,Steven Paul   Account Number:  [id number]  Date Initiated:  07/01/2013  Documentation initiated by:  [facility]AVIS,Nickolas Chalfin  Subjective/Objective Assessment:   LEFT TOTAL HIP REVISION     Action/Plan:   HH   Anticipated DC Date:  07/01/2013   Anticipated DC Plan:  HOME W HOME HEALTH SERVICES      DC Planning Services  CM consult      [facility]   Choice offered to / List presented to:  [name]        HH arranged  HH-2 PT      [facility]  [facility]   Status of service:  Completed, signed off Medicare Important Message given?   (If response is "NO", the following Medicare IM given date fields will be blank) Date Medicare IM given:   Date Additional Medicare IM given:    Discharge Disposition:  HOME W HOME HEALTH SERVICES  Per UR Regulation:    If discussed at Long Length of Stay Meetings, dates discussed:    Comments:  07/01/2013 1100 NCM spoke to pt and [facility]. States no DME needed. He has RW, and 3n1. Isidoro DonningAlesia Demetric Dunnaway RN CCM Case Mgmt phone [phone]

## 2013-07-01 NOTE — Op Note (Signed)
NAME:  Enedina FinnerSCHWARTZ, Alta              ACCOUNT NO.:  [id number]  MEDICAL RECORD NO.:  [id number]  LOCATION:  1618                         [facility]  PHYSICIAN:  Madlyn FrankelMatthew D. Charlann Boxerlin, M.D.  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  PROCEDURE:  Revision of left total hip arthroplasty with a size 54 GRIPTION sector PINNACLE cup with a 36+ 4 neutral AltrX liner and 36+ 5 Delta ceramic ball.  SURGEON:  Madlyn FrankelMatthew D. Charlann Boxerlin, M.D.  ASSISTANT:  Lanney GinsMatthew Babish, PA-C.  Note Mr. Carmon SailsBabish was present for the entirety of the case from preoperative position, perioperative management of operative extremity, general facilitation of the case and primary wound closure.  ANESTHESIA:  General.  SPECIMENS:  The patient's acetabular and femoral head were sent to Pathology per routine for this scenario per surgeon's request.  DRAINS:  None.  BLOOD LOSS:  About 400 mL.  COMPLICATION:  None apparent.  FINDINGS:  The patient noted to have fairly large synovitic response with effusion, but no signs of any muscle damage or trauma and no evidence of any bone injury.  INDICATION FOR PROCEDURE:  Mr. Hermelinda MedicusSchwartz is a 73 year old male with history of left total hip arthroplasty by myself, performed with a DePuy ASR femoral and acetabular components.  He had initially done extremely well and returned back to all activities including playing hockey.  He presented recently over the past year and a half with some increasing pain concerning his left hip.  Workup was negative for significant elevation of metal, cobalt, and chromium levels.  However, he had persistent pain with MRI  revealing no muscle damage and a small effusion noted.  After reviewing with him his current situation and given the persistence of his discomfort, the concern he  had regarding the metallosis and the persistence of the increase in his pain, he wished at this point to have it revised.  We reviewed the risks and benefits.  The risks of infection, DVT, the issues of hip dislocation in the perioperative period.  Despite these risks, he wished to proceed with  a left total hip revision.  Consent was obtained.  PROCEDURE IN DETAIL:  The patient was brought to the operative theater. Once adequate anesthesia and preoperative antibiotics, Ancef, administered, he was positioned in the right lateral decubitus position with left side up.  The left lower extremity was then prepped and draped in sterile fashion.  Time-out was performed identifying the patient, planned procedure, and extremity.  The patient's old incision was identified.  The old scar was excised. The incision was extended slightly proximal distal for revision purpose. Soft tissue dissection was carried down to the iliotibial band and gluteal fascia, which was then split for posterior approach to the hip. The posterior aspect of the hip was exposed noting some synovial fluid within the pseudocapsule present.  Upon entry into the pseudocapsule, identified clear synovial yellow fluid without  significant metal staining, but a moderate effusion for the hip joint.  Following exposure of the posterior half to two-thirds of the acetabulum, the femoral head was dislocated and removed off the trunnion, femoral component was noted to be stable.  Following appropriate preparation of bone along the superior ilium elevating the soft tissues off the ilium, I was able to place the trunnion of the femoral component safely onto the ilium with the femur resting anteriorly.  Once I had adequately exposed the rim of the acetabular component,  we used the Inn[employer] explant system to remove the acetabular component.  The component was removed without significant bone loss.  It was noted to be a 54 mm cup.  I reamed to a 51 reamer and then a 53 reamer.  We then selected a 54 GRIPTION PINNACLE sector cup.  The cup was impacted and sat at approximately 35-40 degrees of abduction and 20 degrees of forward flexion with exposed anterior wall present.  Single cancellous screw was placed.  Then, I placed a trial liner at this point, 36+ 4 and did a trial reduction first  with 36+ 1.5 and then the 36+ 5 ball.  With this, a combined anteversion was noted to be about 50 degrees.  There was no evidence of impingement with forward flexion, internal rotation at 90 degrees or in the sleep position and no evidence of impingement with extension and external rotation.  Given these findings, the trials were dislocated. The femoral head and acetabular liner were removed.  New hole eliminator was placed and the final 36+ 4 neutral AltrX liner was impacted with good visualized rim fit.  The final 36+ 5 Delta ceramic ball was impacted on to clean and dry trunnion and the hip reduced.  We irrigated the hip throughout the case.  Again at this point, I reapproximated the pseudocapsule that was preserved with #1 Vicryl. Posteriorly, the iliotibial band and gluteal fascia were then reapproximated using a combination of #1 Vicryl and 0 V-Loc suture. The remainder wound was closed with 2-0 Vicryl and a running 3-0 Monocryl.  The hip was cleaned dried dressed sterilely using Dermabond and Aquacel dressing.  He was brought to the recovery room in stable condition tolerating the procedure well without evidence of any complication.  He will be weightbearing as tolerated.  He will be seen back in the office in 2 weeks for routine followup.     Madlyn Frankel Charlann Boxer, M.D.     MDO/MEDQ  D:  06/30/2013  T:  07/01/2013  Job:  [id number]

## 2013-07-01 NOTE — Progress Notes (Signed)
   Subjective: 1 Day Post-Op Procedure(s) (LRB): LEFT TOTAL HIP REVISION (Left)   Patient reports pain as mild, pain controlled. No events throughout the night. Ready to be discharged home if he does well with PT and pain stays well controlled.  Objective:   VITALS:   Filed Vitals:   07/01/13 0557  BP: 159/99  Pulse: 70  Temp: 97.8 F (36.6 C)  Resp: 16    Neurovascular intact Dorsiflexion/Plantar flexion intact Incision: dressing C/D/I No cellulitis present Compartment soft  LABS  Recent Labs  07/01/13 0431  HGB 12.9*  HCT 37.7*  WBC 12.7*  PLT 204     Recent Labs  07/01/13 0431  NA 137  K 4.8  BUN 20  CREATININE 1.17  GLUCOSE 190*     Assessment/Plan: 1 Day Post-Op Procedure(s) (LRB): LEFT TOTAL HIP REVISION (Left) Advance diet Up with therapy D/C IV fluids Discharge home with home health [facility] Follow up with OLIN,Alys Dulak D in 2 weeks.  Contact information:  [facility] [address] [place] [id number] [phone]    Expected ABLA  Treated with iron and will observe  Obese (BMI 30-39.9) Estimated body mass index is 31.94 kg/(m^2) as calculated from the following:   Height as of this encounter: 5\' 7"  (1.702 m).   Weight as of this encounter: 92.53 kg (203 lb 15.9 oz). Patient also counseled that weight may inhibit the healing process Patient counseled that losing weight will help with future health issues      Anastasio AuerbachMatthew S. Lesha Jager   PAC  07/01/2013, 8:30 AM

## 2013-07-01 NOTE — Progress Notes (Signed)
Physical Therapy Treatment Patient Details Name: Steven Paul MRN: [id number] DOB: 1940/08/29 Today's Date: 07/01/2013 Time: 4098-11910941-1006 PT Time Calculation (min): 25 min  PT Assessment / Plan / Recommendation  History of Present Illness L  posterior THA    PT Comments   Pt progressing well; wants to go home today if he can get a ride.  Follow Up Recommendations  Home health PT     Does the patient have the potential to tolerate intense rehabilitation     Barriers to Discharge        Equipment Recommendations  None recommended by PT    Recommendations for Other Services    Frequency 7X/week   Progress towards PT Goals Progress towards PT goals: Progressing toward goals  Plan Current plan remains appropriate    Precautions / Restrictions Precautions Precautions: Posterior Hip Restrictions Other Position/Activity Restrictions: WBAT   Pertinent Vitals/Pain BP 174/99    Mobility  Bed Mobility Overal bed mobility: Needs Assistance Bed Mobility: Supine to Sit Supine to sit: Supervision General bed mobility comments: cues for THP Transfers Overall transfer level: Needs assistance Equipment used: Rolling walker (2 wheeled) Transfers: Sit to/from Stand Sit to Stand: Supervision General transfer comment: cues for hand placement,  and THP Ambulation/Gait Ambulation/Gait assistance: Supervision;Modified independent (Device/Increase time) Ambulation Distance (Feet): 380 Feet Assistive device: Rolling walker (2 wheeled) Gait Pattern/deviations: Step-through pattern General Gait Details: cues for step through initially adn RW safety with turns Stairs: Yes Stairs assistance: Min assist Stair Management: With walker;No rails;Step to pattern;Backwards Number of Stairs: 4 General stair comments: cues for sequence    Exercises Total Joint Exercises Ankle Circles/Pumps: AROM;Both;10 reps Quad Sets: AROM;Both;10 reps Heel Slides: AAROM;Left;10 reps Hip ABduction/ADduction:  AAROM;Left;10 reps   PT Diagnosis:    PT Problem List:   PT Treatment Interventions:     PT Goals (current goals can now be found in the care plan section) Acute Rehab PT Goals Patient Stated Goal: I and  home soon PT Goal Formulation: With patient Time For Goal Achievement: 07/07/13 Potential to Achieve Goals: Good  Visit Information  Last PT Received On: 07/01/13 Assistance Needed: +1 History of Present Illness: L  posterior THA     Subjective Data  Patient Stated Goal: I and  home soon   Cognition  Cognition Arousal/Alertness: Awake/alert Behavior During Therapy: WFL for tasks assessed/performed Overall Cognitive Status: Within Functional Limits for tasks assessed    Balance     End of Session PT - End of Session Equipment Utilized During Treatment: Gait belt Activity Tolerance: Patient tolerated treatment well Patient left: in chair;with call bell/phone within reach Nurse Communication: Mobility status   GP     [facility]WILLIAMS,Steven Paul 07/01/2013, 10:12 AM

## 2013-07-01 NOTE — Evaluation (Signed)
Occupational Therapy Evaluation Patient Details Name: Steven Paul MRN: [id number] DOB: 07-07-1940 Today's Date: 07/01/2013 Time: 4268-3419 OT Time Calculation (min): 18 min  OT Assessment / Plan / Recommendation History of present illness L  posterior THA    Clinical Impression   Pt was admitted for the above surgery.  He has a h/o L THA, posterior approach in 2007.  Would benefit from continued OT [facility]) to reinforce THPs with adls.    OT Assessment  All further OT needs can be met in the next venue of care    Follow Up Recommendations  Home health OT    Barriers to Discharge      Equipment Recommendations   (has 3:1 commode)    Recommendations for Other Services    Frequency       Precautions / Restrictions Precautions Precautions: Posterior Hip Restrictions Other Position/Activity Restrictions: WBAT   Pertinent Vitals/Pain Pain is "ok"    ADL  Transfers/Ambulation Related to ADLs: supervision sit to stand and steps ADL Comments: reviewed adls and thps. Pt stood to use urinal.   Pt still has reacher but plans to have friend assist with adls.  Explained uses of reacher and encouraged him to keep in nearby to helpful stop automatic reaching.  Educated not to reach below knees as a general rule and stand for toilet hygiene.  Pt verbalized 3/3 thps.  Demonstrated side stepping into tub and encouraged him to have [place] therapies practice with him    OT Diagnosis: Generalized weakness  OT Problem List: Decreased strength;Decreased knowledge of use of DME or AE;Decreased knowledge of precautions OT Treatment Interventions:     OT Goals(Current goals can be found in the care plan section) Acute Rehab OT Goals Patient Stated Goal: leave today; ride arranged for 1130  Visit Information  Assistance Needed: +1 History of Present Illness: L  posterior THA        Prior [place] expects to be discharged to:: Private residence Living  Arrangements: Other (Comment);Spouse/significant other Home Equipment: Bedside commode Additional Comments: will have 24/7 Prior Function Level of Independence: Independent Communication Communication: No difficulties         Vision/Perception     Cognition  Cognition Arousal/Alertness: Awake/alert Behavior During Therapy: WFL for tasks assessed/performed Overall Cognitive Status: Within Functional Limits for tasks assessed    Extremity/Trunk Assessment Upper Extremity Assessment Upper Extremity Assessment: Overall WFL for tasks assessed     Mobility  Transfers Overall transfer level: Needs assistance Equipment used: Rolling walker (2 wheeled) Transfers: Sit to/from Stand Sit to Stand: Supervision General transfer comment: cues for LLE placement, especially stand to sit     Exercise    Balance     End of Session OT - End of Session Activity Tolerance: Patient tolerated treatment well Patient left: in chair;with call bell/phone within reach  [place] 07/01/2013, 10:43 AM Lesle Chris, OTR/L [phone] 07/01/2013

## 2013-07-02 ENCOUNTER — Encounter (HOSPITAL_COMMUNITY): Payer: Self-pay | Admitting: Orthopedic Surgery

## 2013-07-04 ENCOUNTER — Telehealth: Payer: Self-pay

## 2013-07-04 NOTE — Telephone Encounter (Signed)
Nurse states patients bp is 160/98 and 170/100  He is post op five days from hip replacement.   He is dizzy and throwing up.   [phone]

## 2013-07-07 ENCOUNTER — Emergency Department (HOSPITAL_COMMUNITY): Payer: PRIVATE HEALTH INSURANCE

## 2013-07-07 ENCOUNTER — Inpatient Hospital Stay (HOSPITAL_COMMUNITY)
Admission: EM | Admit: 2013-07-07 | Discharge: 2013-07-11 | DRG: 187 | Disposition: A | Discharge status: Home Health | Payer: PRIVATE HEALTH INSURANCE | Attending: Internal Medicine | Admitting: Internal Medicine

## 2013-07-07 ENCOUNTER — Encounter (HOSPITAL_COMMUNITY): Payer: Self-pay | Admitting: Emergency Medicine

## 2013-07-07 DIAGNOSIS — Z8249 Family history of ischemic heart disease and other diseases of the circulatory system: Secondary | ICD-10-CM

## 2013-07-07 DIAGNOSIS — E871 Hypo-osmolality and hyponatremia: Secondary | ICD-10-CM

## 2013-07-07 DIAGNOSIS — J9819 Other pulmonary collapse: Secondary | ICD-10-CM | POA: Diagnosis present

## 2013-07-07 DIAGNOSIS — E785 Hyperlipidemia, unspecified: Secondary | ICD-10-CM | POA: Diagnosis present

## 2013-07-07 DIAGNOSIS — D500 Iron deficiency anemia secondary to blood loss (chronic): Secondary | ICD-10-CM

## 2013-07-07 DIAGNOSIS — R091 Pleurisy: Secondary | ICD-10-CM | POA: Diagnosis present

## 2013-07-07 DIAGNOSIS — I959 Hypotension, unspecified: Secondary | ICD-10-CM | POA: Diagnosis present

## 2013-07-07 DIAGNOSIS — R0789 Other chest pain: Secondary | ICD-10-CM | POA: Diagnosis present

## 2013-07-07 DIAGNOSIS — E669 Obesity, unspecified: Secondary | ICD-10-CM

## 2013-07-07 DIAGNOSIS — D72829 Elevated white blood cell count, unspecified: Secondary | ICD-10-CM

## 2013-07-07 DIAGNOSIS — J90 Pleural effusion, not elsewhere classified: Principal | ICD-10-CM | POA: Diagnosis present

## 2013-07-07 DIAGNOSIS — J189 Pneumonia, unspecified organism: Secondary | ICD-10-CM | POA: Insufficient documentation

## 2013-07-07 DIAGNOSIS — H9319 Tinnitus, unspecified ear: Secondary | ICD-10-CM | POA: Diagnosis present

## 2013-07-07 DIAGNOSIS — I10 Essential (primary) hypertension: Secondary | ICD-10-CM | POA: Diagnosis present

## 2013-07-07 DIAGNOSIS — Z7982 Long term (current) use of aspirin: Secondary | ICD-10-CM

## 2013-07-07 DIAGNOSIS — R739 Hyperglycemia, unspecified: Secondary | ICD-10-CM

## 2013-07-07 DIAGNOSIS — E663 Overweight: Secondary | ICD-10-CM

## 2013-07-07 DIAGNOSIS — N179 Acute kidney failure, unspecified: Secondary | ICD-10-CM | POA: Diagnosis present

## 2013-07-07 DIAGNOSIS — R079 Chest pain, unspecified: Secondary | ICD-10-CM | POA: Diagnosis not present

## 2013-07-07 DIAGNOSIS — Z96649 Presence of unspecified artificial hip joint: Secondary | ICD-10-CM

## 2013-07-07 LAB — I-STAT CHEM 8, ED
BUN: 27 mg/dL — AB (ref 6–23)
Calcium, Ion: 1.13 mmol/L (ref 1.13–1.30)
Chloride: 98 mEq/L (ref 96–112)
Creatinine, Ser: 1.4 mg/dL — ABNORMAL HIGH (ref 0.50–1.35)
Glucose, Bld: 164 mg/dL — ABNORMAL HIGH (ref 70–99)
HEMATOCRIT: 41 % (ref 39.0–52.0)
Hemoglobin: 13.9 g/dL (ref 13.0–17.0)
Potassium: 3.8 mEq/L (ref 3.7–5.3)
SODIUM: 136 meq/L — AB (ref 137–147)
TCO2: 26 mmol/L (ref 0–100)

## 2013-07-07 LAB — CBC
HCT: 35.5 % — ABNORMAL LOW (ref 39.0–52.0)
Hemoglobin: 12.2 g/dL — ABNORMAL LOW (ref 13.0–17.0)
MCH: 31.4 pg (ref 26.0–34.0)
MCHC: 34.4 g/dL (ref 30.0–36.0)
MCV: 91.5 fL (ref 78.0–100.0)
PLATELETS: 271 10*3/uL (ref 150–400)
RBC: 3.88 MIL/uL — ABNORMAL LOW (ref 4.22–5.81)
RDW: 14.3 % (ref 11.5–15.5)
WBC: 13.5 10*3/uL — AB (ref 4.0–10.5)

## 2013-07-07 LAB — CBC WITH DIFFERENTIAL/PLATELET
BASOS ABS: 0 10*3/uL (ref 0.0–0.1)
BASOS PCT: 0 % (ref 0–1)
EOS PCT: 0 % (ref 0–5)
Eosinophils Absolute: 0 10*3/uL (ref 0.0–0.7)
HCT: 37.7 % — ABNORMAL LOW (ref 39.0–52.0)
Hemoglobin: 13.1 g/dL (ref 13.0–17.0)
LYMPHS PCT: 10 % — AB (ref 12–46)
Lymphs Abs: 1.5 10*3/uL (ref 0.7–4.0)
MCH: 31.8 pg (ref 26.0–34.0)
MCHC: 34.7 g/dL (ref 30.0–36.0)
MCV: 91.5 fL (ref 78.0–100.0)
Monocytes Absolute: 2.3 10*3/uL — ABNORMAL HIGH (ref 0.1–1.0)
Monocytes Relative: 15 % — ABNORMAL HIGH (ref 3–12)
Neutro Abs: 11.5 10*3/uL — ABNORMAL HIGH (ref 1.7–7.7)
Neutrophils Relative %: 75 % (ref 43–77)
PLATELETS: 282 10*3/uL (ref 150–400)
RBC: 4.12 MIL/uL — ABNORMAL LOW (ref 4.22–5.81)
RDW: 14.1 % (ref 11.5–15.5)
WBC: 15.4 10*3/uL — ABNORMAL HIGH (ref 4.0–10.5)

## 2013-07-07 LAB — CREATININE, SERUM
CREATININE: 1.3 mg/dL (ref 0.50–1.35)
GFR, EST AFRICAN AMERICAN: 62 mL/min — AB (ref >90)
GFR, EST NON AFRICAN AMERICAN: 54 mL/min — AB (ref >90)

## 2013-07-07 LAB — I-STAT TROPONIN, ED: Troponin i, poc: 0 ng/mL (ref 0.00–0.08)

## 2013-07-07 LAB — TROPONIN I: Troponin I: <0.3 ng/mL (ref <0.30)

## 2013-07-07 MED ORDER — DOCUSATE SODIUM 100 MG PO CAPS
100.0000 mg | ORAL_CAPSULE | Freq: Two times a day (BID) | ORAL | Status: DC
  Administered 2013-07-07 – 2013-07-11 (×8): 100 mg via ORAL
  Filled 2013-07-07 (×10): qty 1

## 2013-07-07 MED ORDER — AZITHROMYCIN 500 MG PO TABS
500.0000 mg | ORAL_TABLET | Freq: Every day | ORAL | Status: AC
  Administered 2013-07-08: 500 mg via ORAL
  Filled 2013-07-07: qty 1

## 2013-07-07 MED ORDER — ONDANSETRON HCL 4 MG PO TABS: 4.0000 mg | ORAL_TABLET | Freq: Four times a day (QID) | ORAL | Status: DC | PRN

## 2013-07-07 MED ORDER — OXYCODONE HCL 5 MG PO TABS
5.0000 mg | ORAL_TABLET | ORAL | Status: DC | PRN
  Administered 2013-07-07 – 2013-07-08 (×5): 5 mg via ORAL
  Filled 2013-07-07 (×5): qty 1

## 2013-07-07 MED ORDER — DEXTROSE 5 % IV SOLN
1.0000 g | Freq: Once | INTRAVENOUS | Status: AC
  Administered 2013-07-07: 1 g via INTRAVENOUS
  Filled 2013-07-07: qty 10

## 2013-07-07 MED ORDER — ALUM & MAG HYDROXIDE-SIMETH 200-200-20 MG/5ML PO SUSP: 30.0000 mL | Freq: Four times a day (QID) | ORAL | Status: DC | PRN

## 2013-07-07 MED ORDER — MORPHINE SULFATE 2 MG/ML IJ SOLN
2.0000 mg | INTRAMUSCULAR | Status: DC | PRN
  Administered 2013-07-08 (×3): 2 mg via INTRAVENOUS
  Filled 2013-07-07 (×3): qty 1

## 2013-07-07 MED ORDER — ONDANSETRON HCL 4 MG/2ML IJ SOLN: 4.0000 mg | Freq: Four times a day (QID) | INTRAMUSCULAR | Status: DC | PRN

## 2013-07-07 MED ORDER — POLYETHYLENE GLYCOL 3350 17 G PO PACK
17.0000 g | PACK | Freq: Two times a day (BID) | ORAL | Status: DC
  Administered 2013-07-07 – 2013-07-11 (×8): 17 g via ORAL
  Filled 2013-07-07 (×10): qty 1

## 2013-07-07 MED ORDER — AZITHROMYCIN 250 MG PO TABS
500.0000 mg | ORAL_TABLET | Freq: Once | ORAL | Status: AC
  Administered 2013-07-07: 500 mg via ORAL
  Filled 2013-07-07: qty 2

## 2013-07-07 MED ORDER — IOHEXOL 350 MG/ML SOLN
100.0000 mL | Freq: Once | INTRAVENOUS | Status: AC | PRN
  Administered 2013-07-07: 100 mL via INTRAVENOUS

## 2013-07-07 MED ORDER — SODIUM CHLORIDE 0.9 % IV SOLN
INTRAVENOUS | Status: DC
  Administered 2013-07-07 – 2013-07-08 (×2): via INTRAVENOUS

## 2013-07-07 MED ORDER — HEPARIN SODIUM (PORCINE) 5000 UNIT/ML IJ SOLN
5000.0000 [IU] | Freq: Three times a day (TID) | INTRAMUSCULAR | Status: DC
  Administered 2013-07-07 – 2013-07-11 (×11): 5000 [IU] via SUBCUTANEOUS
  Filled 2013-07-07 (×16): qty 1

## 2013-07-07 MED ORDER — ASPIRIN EC 325 MG PO TBEC
325.0000 mg | DELAYED_RELEASE_TABLET | Freq: Two times a day (BID) | ORAL | Status: DC
  Administered 2013-07-07 – 2013-07-11 (×8): 325 mg via ORAL
  Filled 2013-07-07 (×11): qty 1

## 2013-07-07 MED ORDER — SODIUM CHLORIDE 0.9 % IJ SOLN
3.0000 mL | Freq: Two times a day (BID) | INTRAMUSCULAR | Status: DC
  Administered 2013-07-07 – 2013-07-11 (×7): 3 mL via INTRAVENOUS

## 2013-07-07 MED ORDER — SIMVASTATIN 20 MG PO TABS
20.0000 mg | ORAL_TABLET | Freq: Every day | ORAL | Status: DC
  Administered 2013-07-07 – 2013-07-10 (×4): 20 mg via ORAL
  Filled 2013-07-07 (×6): qty 1

## 2013-07-07 MED ORDER — AZITHROMYCIN 250 MG PO TABS
250.0000 mg | ORAL_TABLET | Freq: Every day | ORAL | Status: DC
  Administered 2013-07-09 – 2013-07-11 (×3): 250 mg via ORAL
  Filled 2013-07-07 (×3): qty 1

## 2013-07-07 NOTE — ED Notes (Addendum)
Per EMS, pt has had left sided chest pain since 7PM last night. Pt denies nausea, SOB, pain radiating to other areas. Pt states pain is worse when he takes a deep breath. Pt had left sided hip replacement last Monday. EMS states pt's blood pressure was initially 74/56, then rose to 112/70 without any intervention. Pt states he took 325mg  aspirin 1 hour ago

## 2013-07-07 NOTE — ED Provider Notes (Addendum)
CSN: [id number]     Arrival date & time 07/07/13  1239 History   First MD Initiated Contact with Patient 07/07/13 1307     Chief Complaint  Patient presents with  . Chest Pain     (Consider location/radiation/quality/duration/timing/severity/associated sxs/prior Treatment) Patient is a 73 y.o. male presenting with chest pain. The history is provided by the patient.  Chest Pain Pain location:  L chest Pain quality: sharp, shooting and stabbing   Pain radiates to:  Does not radiate Pain radiates to the back: no   Pain severity:  Severe Onset quality:  Sudden Duration:  12 hours Timing:  Constant Progression:  Unchanged Chronicity:  New Context: breathing   Relieved by:  Nothing Worsened by:  Deep breathing and coughing Associated symptoms: no abdominal pain, no cough, no nausea, no shortness of breath and no weakness   Associated symptoms comment:  Earlier today patient was near syncopal and described feeling lightheaded, sweaty and slightly nauseated. At the time he was feeling this way EMS arrived and he had a blood pressure in the 70s and a heart rate in the 50s.  That resolved and he feels much better now. Risk factors: high cholesterol, hypertension and surgery   Risk factors: no coronary artery disease, no diabetes mellitus and no smoking   Risk factors comment:  Recent hip replacement 7 days ago   Past Medical History  Diagnosis Date  . Hypertension   . Hyperlipidemia   . Internal hemorrhoids   . Pneumonia 05/2008  . Tinnitus     both ears all the time  . Arthritis    Past Surgical History  Procedure Laterality Date  . Tonsillectomy and adenoidectomy  as child  . 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 Total hip arthroplasty  02/06/2012    Procedure: TOTAL HIP ARTHROPLASTY ANTERIOR APPROACH;  Surgeon: [name], MD;  Location: [facility];  Service: Orthopedics;  Laterality: Right;  . Total hip revision Left 06/30/2013    Procedure: LEFT TOTAL HIP REVISION;   Surgeon: [name], MD;  Location: [facility];  Service: Orthopedics;  Laterality: Left;   Family History  Problem Relation Age of Onset  . Heart disease Mother   . Heart disease Father    History  Substance Use Topics  . Smoking status: Never Smoker   . Smokeless tobacco: Never Used  . Alcohol Use: Yes     Comment: beer sometimes    Review of Systems  Respiratory: Negative for cough and shortness of breath.   Cardiovascular: Positive for chest pain.  Gastrointestinal: Negative for nausea and abdominal pain.  Neurological: Negative for weakness.  All other systems reviewed and are negative.      Allergies  Review of patient's allergies indicates no known allergies.  Home Medications   Current Outpatient Rx  Name  Route  Sig  Dispense  Refill  . aspirin EC 325 MG EC tablet   Oral   Take 1 tablet (325 mg total) by mouth 2 (two) times daily.   60 tablet   0   . docusate sodium 100 MG CAPS   Oral   Take 100 mg by mouth 2 (two) times daily.   10 capsule   0   . ferrous sulfate 325 (65 FE) MG tablet   Oral   Take 1 tablet (325 mg total) by mouth 3 (three) times daily after meals.      3   . HYDROcodone-acetaminophen (NORCO) 7.5-325  MG per tablet   Oral   Take 1-2 tablets by mouth every 4 (four) hours as needed for moderate pain.   100 tablet   0   . lisinopril-hydrochlorothiazide (PRINZIDE,ZESTORETIC) 20-12.5 MG per tablet   Oral   Take 1 tablet by mouth every morning.         . polyethylene glycol (MIRALAX / GLYCOLAX) packet   Oral   Take 17 g by mouth 2 (two) times daily.   14 each   0   . simvastatin (ZOCOR) 20 MG tablet   Oral   Take 20 mg by mouth at bedtime.          Marland Kitchen tiZANidine (ZANAFLEX) 4 MG capsule   Oral   Take 1 capsule (4 mg total) by mouth 3 (three) times daily as needed for muscle spasms.   50 capsule   0    BP 110/73  Pulse 73  Temp(Src) 99.1 F (37.3 C) (Oral)  Resp 14  SpO2 94% Physical Exam  Nursing note and  vitals reviewed. Constitutional: He is oriented to person, place, and time. He appears well-developed and well-nourished. No distress.  HENT:  Head: Normocephalic and atraumatic.  Mouth/Throat: Oropharynx is clear and moist.  Eyes: Conjunctivae and EOM are normal. Pupils are equal, round, and reactive to light.  Neck: Normal range of motion. Neck supple.  Cardiovascular: Normal rate, regular rhythm and intact distal pulses.   No murmur heard. Pulmonary/Chest: Effort normal and breath sounds normal. No respiratory distress. He has no wheezes. He has no rales. He exhibits tenderness.    Abdominal: Soft. He exhibits no distension. There is no tenderness. There is no rebound and no guarding.  Musculoskeletal: Normal range of motion. He exhibits no edema and no tenderness.  Surgical site is well appearing with dressing intact  Neurological: He is alert and oriented to person, place, and time.  Skin: Skin is warm and dry. No rash noted. No erythema.  Psychiatric: He has a normal mood and affect. His behavior is normal.    ED Course  Procedures (including critical care time) Labs Review Labs Reviewed  CBC WITH DIFFERENTIAL - Abnormal; Notable for the following:    WBC 15.4 (*)    RBC 4.12 (*)    HCT 37.7 (*)    Neutro Abs 11.5 (*)    Lymphocytes Relative 10 (*)    Monocytes Relative 15 (*)    Monocytes Absolute 2.3 (*)    All other components within normal limits  I-STAT CHEM 8, ED - Abnormal; Notable for the following:    Sodium 136 (*)    BUN 27 (*)    Creatinine, Ser 1.40 (*)    Glucose, Bld 164 (*)    All other components within normal limits   Imaging Review Dg Chest 2 View  07/07/2013   CLINICAL DATA:  Chest pain  EXAM: CHEST  2 VIEW  COMPARISON:  DG CHEST 2 VIEW dated 06/25/2013  FINDINGS: The heart size and mediastinal contours are within normal limits. Both lungs are clear. The visualized skeletal structures are unremarkable.  IMPRESSION: No active cardiopulmonary disease.    Electronically Signed   By: Elige Ko   On: 07/07/2013 14:16   Ct Angio Chest Pe W/cm &/or Wo Cm  07/07/2013   CLINICAL DATA:  Left-sided chest pain with shortness of breath. Question pulmonary embolism.  EXAM: CT ANGIOGRAPHY CHEST WITH CONTRAST  TECHNIQUE: Multidetector CT imaging of the chest was performed using the standard protocol during bolus  administration of intravenous contrast. Multiplanar CT image reconstructions and MIPs were obtained to evaluate the vascular anatomy.  CONTRAST:  100mL OMNIPAQUE IOHEXOL 350 MG/ML SOLN  COMPARISON:  DG CHEST 2 VIEW dated 07/07/2013; DG CHEST 2 VIEW dated 06/25/2013; CT CHEST W/O CM dated 05/25/2008  FINDINGS: The pulmonary arteries are well opacified with contrast. There is no evidence of acute pulmonary embolism. Atherosclerosis of the aorta, coronary arteries and great vessels is noted. The heart size is stable. There are scattered small mediastinal lymph nodes which are unchanged.  The previously demonstrated right pleural effusion has resolved. There is a small dependent left pleural effusion. There is no pericardial effusion. Mild dependent left lower lobe atelectasis is similar to the prior study. The right lung base is now clear. No airspace disease or endobronchial lesion is demonstrated. There is stable mild emphysema.  The visualized upper abdomen has a stable appearance. There are no worrisome osseous findings.  Review of the MIP images confirms the above findings.  IMPRESSION: Button  1. No evidence of acute pulmonary embolism. 2. Interval resolution of right pleural effusion and right basilar airspace disease. There is a small left pleural effusion with adjacent left lower lobe atelectasis. 3. Diffuse atherosclerosis.   Electronically Signed   By: Roxy HorsemanBill  Veazey M.D.   On: 07/07/2013 15:45     EKG Interpretation   Date/Time:  Monday July 07 2013 12:50:17 EST Ventricular Rate:  76 PR Interval:  173 QRS Duration: 96 QT Interval:  421 QTC Calculation:  473 R Axis:   -3 Text Interpretation:  Sinus rhythm Left ventricular hypertrophy Borderline  T abnormalities, diffuse leads No significant change since last tracing  Confirmed by Anitra LauthPLUNKETT  MD, Alphonzo LemmingsWHITNEY ([id number]) on 07/07/2013 2:02:58 PM      MDM   Final diagnoses:  CAP (community acquired pneumonia)   Patient's with a history of hip replacement approximately 7 days ago who developed severe pleuritic left-sided chest pain last night. Because of persistent pain and mild shortness of breath EMS was called today the patient was near syncopal and bleeds with a heart rate of 54 and a blood pressure in the 70s. Now he feels much better and his only complaint is of left-sided chest pain. History of PE or coronary disease. Patient's history is not consistent with a cardiac cause however concern for PE given recent hip replacement.  Vital signs are normal here with a blood pressure of 110/70 heart rate in the 70s and oxygen saturation of 94%.   CTA, CXR, CBC, i-stat wnl.  3:50 PM Labs with mild leukocytosis.  CT was neg for PE but left pleural effusion and mild atelectasis with possible early infection given leukocytosis/temp of 99.  O/w no acute findings.  Pt desating to 88% while speaking and improves with slow deep breaths.  Prior hx of severe cap in the past.  Will treat with rocephin and azithro and admit for obs and treatment to ensure improved O2 sats prior to d/c. Gwyneth SproutWhitney Ghadeer Kastelic, MD 07/07/13 1552  Gwyneth SproutWhitney Marguis Mathieson, MD 07/07/13 1606  Gwyneth SproutWhitney Breunna Nordmann, MD 07/07/13 [id number]

## 2013-07-07 NOTE — ED Notes (Signed)
Bed: [id number] Expected date:  Expected time: 12:41 PM Means of arrival:  Comments: EMS - Syncope, SNF, brady HR 54, stable

## 2013-07-07 NOTE — Progress Notes (Signed)
   CARE MANAGEMENT ED NOTE 07/07/2013  Patient:  Steven Paul,Steven Paul   Account Number:  [id number]  Date Initiated:  07/07/2013  Documentation initiated by:  Radford PaxFERRERO,Keshawn Sundberg  Subjective/Objective Assessment:   Patient presents to ED with chest pain and shortness of breath     Subjective/Objective Assessment Detail:   Patient is s/p left hip replacement 06/30/2013. Current wbc 15.4     Action/Plan:   chest xray and ct angio performed in ED.   Action/Plan Detail:   Patient to be admitted.   Anticipated DC Date:       Status Recommendation to Physician:   Result of Recommendation:    Other ED Services  Consult Working Plan    DC [employer]  Other    Choice offered to / List presented to:            Status of service:  Completed, signed off  ED Comments:   ED Comments Detail:  EDCM spoke to patient and patient's son at bedside. Patient's son Casimiro NeedleMichael [phone].  Patient reports he lives by himself, but family checks in on him.  Patient has a walker, cane and bedside commode at home.  Patient reports he is [facility].

## 2013-07-07 NOTE — Discharge Summary (Signed)
Physician Discharge Summary  Patient ID: Steven Paul MRN: [id number] DOB/AGE: [birth year] 73 y.o.  Admit date: 06/30/2013 Discharge date: 07/01/2013   Procedures:  Procedure(s) (LRB): LEFT TOTAL HIP REVISION (Left)  Attending Physician:  Dr. 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 Hypertension   . Hyperlipidemia   . Internal hemorrhoids   . Pneumonia 05/2008  . Tinnitus     both ears all the time  . Arthritis     HPI: Steven Paul, 73 y.o. 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 The indications for the revision total hip arthroplasty are bearing surface wear leading to symptomatic synovitis. Onset of symptoms was gradual starting 6-7 years ago with gradually worsening course since that time. Prior procedures on the left hip include arthroplasty. Patient currently rates pain in the left hip at 8 out of 10 with activity. There is night pain, worsening of pain with activity and weight bearing, trendelenberg gait, pain that interfers with activities of daily living and pain with passive range of motion. 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 This condition presents safety issues increasing the risk of falls. There is no current active infection. Risks, benefits and expectations were discussed with the patient. Risks including but not limited to the risk of anesthesia, blood clots, nerve damage, blood vessel damage, failure of the prosthesis, infection and up to and including death. Patient understand the risks, benefits and expectations and wishes to proceed with surgery.   PCP: Tonye Pearson, MD   Discharged Condition: good  Hospital  Course:  Patient underwent the above stated procedure on 06/30/2013. Patient tolerated the procedure well and brought to the recovery room in good condition and subsequently to the floor.  POD #1 BP: 159/99 ; Pulse: 70 ; Temp: 97.8 F (36.6 C) ; Resp: 16  Patient reports pain as mild, pain controlled. No events throughout the night. Ready to be discharged home  Neurovascular intact, dorsiflexion/plantar flexion intact, incision: dressing C/D/I, no cellulitis present and compartment soft.   LABS  Basename    HGB  12.9  HCT  37.7    Discharge Exam: General appearance: alert, cooperative and no distress Extremities: Homans sign is negative, no sign of DVT, no edema, redness or tenderness in the calves or thighs and no ulcers, gangrene or trophic changes  Disposition:   Home-Health Care Svc with follow up in 2 weeks   Follow-up Information   Follow up with [name], MD. Schedule an appointment as soon as possible for a visit in 2 weeks.   Specialty:  Orthopedic Surgery   Contact information:   [address] [place] [address] [phone]       Discharge Orders   Future Appointments Provider Department Dept Phone   12/03/2013 72 10:30 AM Tonye Pearson, MD [facility] [phone]   Future Orders Complete By Expires   Call MD / Call 911  As directed    Comments:     If you experience chest pain or shortness of breath, CALL 911 and be transported to the hospital emergency room.  If you develope a fever above 101 F, pus (white  drainage) or increased drainage or redness at the wound, or calf pain, call your surgeon's office.   Change dressing  As directed    Comments:     Maintain surgical dressing for 10-14 days, or until follow up in the clinic.   Constipation Prevention  As directed    Comments:     Drink plenty of fluids.  Prune juice may be helpful.  You may use a stool softener, such as Colace (over the counter) 100 mg twice a day.   Use MiraLax (over the counter) for constipation as needed.   Diet - low sodium heart healthy  As directed    Discharge instructions  As directed    Comments:     Maintain surgical dressing for 10-14 days, or until follow up in the clinic. [facility] Call with any questions or concerns.   Increase activity slowly as tolerated  As directed    TED hose  As directed    Comments:     Use stockings (TED hose) for 2 weeks on both leg(s).  You may remove them at night for sleeping.   Weight bearing as tolerated  As directed    Questions:     Laterality:     Extremity:         Signed: Anastasio AuerbachMatthew S. Elysia Grand   PAC  07/07/2013, 3:03 PM

## 2013-07-07 NOTE — H&P (Signed)
[facility] History and Physical  Steven Paul WUJ:[id number] DOB: 04/18/1941 DOA: 07/07/2013  Referring physician:  PCP: Tonye Pearson, MD   Chief Complaint: Chest pain  HPI: Steven Paul is a 73 y.o. 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 he tolerated the procedure well as there were no immediate complications during that hospitalization. He was discharged on the following day on 07/01/2013 to his home with home health services   providing home PT. Patient had been doing well at home until 2:00 this morning when he experienced sudden onset right-sided chest pain characterized as sharp, stabbing, precipitated by taking deep inspirations. He reported associated cough characterized as nonproductive. he denied associated fevers, chills, nausea, vomiting, malaise, generalized weakness, recent travel or sick contacts. He reports that on the field the head a low blood pressure in the 70s, per ER documentation, he presented with a blood pressure 110/73, temp of 99.1, respiratory rate of 14 with an O2 sat of 9094% on room air. Initial chest x-ray showed no acute cardiopulmonary disease. He was further worked up with a CT scan with IV contrast in the emergency room which did not show evidence of acute pulmonary embolism. Labs did reveal an elevated white count of 15,000.                                                                                                                                                                                                                                                             Review of Systems:  Constitutional:  No weight loss, night sweats, Fevers, chills, fatigue.  HEENT:  No headaches, Difficulty swallowing,Tooth/dental problems,Sore throat,  No sneezing, itching, ear ache,  nasal congestion, post nasal drip,  Cardio-vascular:  Positive for chest pain worsened by deep inspiration, No Orthopnea, PND, swelling in lower extremities, anasarca, dizziness, palpitations  GI:  No heartburn, indigestion, abdominal pain, nausea, vomiting, diarrhea, change in bowel habits, loss of appetite  Resp:  No shortness of breath with exertion or at rest. No excess mucus, no productive cough, No non-productive cough, No coughing up of blood.No change in color of mucus.No wheezing.No chest wall deformity  Skin:  no rash or lesions.  GU:  no dysuria, change in color of urine, no urgency or frequency. No flank pain.  Musculoskeletal:  No joint pain or swelling. No decreased range of motion. No back pain.  Psych:  No change in mood or affect. No depression or anxiety. No memory loss.   Past Medical History  Diagnosis Date  . Hypertension   . Hyperlipidemia   . Internal hemorrhoids   . Pneumonia 05/2008  . Tinnitus     both ears all the time  . Arthritis    Past Surgical History  Procedure Laterality Date  . Tonsillectomy and adenoidectomy  as child  . 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 Total hip arthroplasty  02/06/2012    Procedure: TOTAL HIP ARTHROPLASTY ANTERIOR APPROACH;  Surgeon: [name], MD;  Location: [facility];  Service: Orthopedics;  Laterality: Right;  . Total hip revision Left 06/30/2013    Procedure: LEFT TOTAL HIP REVISION;  Surgeon: [name], MD;  Location: [facility];  Service: Orthopedics;  Laterality: Left;   Social History:  reports that he has never smoked. He has never used smokeless tobacco. He reports that he drinks alcohol. He reports that he does not use illicit drugs.  No Known Allergies  Family History  Problem Relation Age of Onset  . Heart disease Mother   . Heart disease Father      Prior to Admission medications   Medication Sig Start Date End Date Taking? Authorizing Provider  aspirin EC 325 MG EC tablet Take 1 tablet (325  mg total) by mouth 2 (two) times daily. 07/01/13 07/29/13 Yes Genelle Gather Babish, PA-C  docusate sodium 100 MG CAPS Take 100 mg by mouth 2 (two) times daily. 07/01/13  Yes Genelle Gather Babish, PA-C  ferrous sulfate 325 (65 FE) MG tablet Take 1 tablet (325 mg total) by mouth 3 (three) times daily after meals. 07/01/13  Yes Genelle Gather Babish, PA-C  HYDROcodone-acetaminophen (NORCO) 7.5-325 MG per tablet Take 1-2 tablets by mouth every 4 (four) hours as needed for moderate pain. 07/01/13  Yes Genelle Gather Babish, PA-C  lisinopril-hydrochlorothiazide (PRINZIDE,ZESTORETIC) 20-12.5 MG per tablet Take 1 tablet by mouth every morning. 06/04/13  Yes Tonye Pearson, MD  polyethylene glycol [facility] / GLYCOLAX) packet Take 17 g by mouth 2 (two) times daily. 07/01/13  Yes Genelle Gather Babish, PA-C  simvastatin (ZOCOR) 20 MG tablet Take 20 mg by mouth at bedtime.  06/04/13  Yes Tonye Pearson, MD  tiZANidine (ZANAFLEX) 4 MG capsule Take 1 capsule (4 mg total) by mouth 3 (three) times daily as needed for muscle spasms. 07/01/13  Yes Genelle Gather Babish, PA-C   Physical Exam: Filed Vitals:   07/07/13 1721  BP: 125/70  Pulse: 75  Temp:   Resp: 18    BP 125/70  Pulse 75  Temp(Src) 99.1 F (37.3 C) (Oral)  Resp 18  SpO2 95%  General:  Appears calm and comfortable, in no acute distress Eyes: PERRL, normal lids, irises & conjunctiva ENT: grossly normal hearing, lips & tongue Neck: no LAD, masses or thyromegaly Cardiovascular: RRR, no m/r/g. No LE edema. Telemetry: SR, no arrhythmias  Respiratory: CTA bilaterally, having a few by basilar crackles, otherwise normal respiratory effort, breathing comfortably on room air Abdomen: soft, ntnd Skin: no rash or induration seen on limited exam Musculoskeletal: He has some pain with passive and active movement of his left lower extremity attributing to recent hip  surgery. No edema or evidence of bleed/infection Psychiatric: grossly normal mood and  affect, speech fluent and appropriate Neurologic: grossly non-focal.          Labs on Admission:  Basic Metabolic Panel:  Recent Labs Lab 07/01/13 0431 07/07/13 1415  NA 137 136*  K 4.8 3.8  CL 99 98  CO2 23  --   GLUCOSE 190* 164*  BUN 20 27*  CREATININE 1.17 1.40*  CALCIUM 8.9  --    Liver Function Tests: No results found for this basename: AST, ALT, ALKPHOS, BILITOT, PROT, ALBUMIN,  in the last 168 hours No results found for this basename: LIPASE, AMYLASE,  in the last 168 hours No results found for this basename: AMMONIA,  in the last 168 hours CBC:  Recent Labs Lab 07/01/13 0431 07/07/13 1408 07/07/13 1415  WBC 12.7* 15.4*  --   NEUTROABS  --  11.5*  --   HGB 12.9* 13.1 13.9  HCT 37.7* 37.7* 41.0  MCV 91.5 91.5  --   PLT 204 282  --    Cardiac Enzymes: No results found for this basename: CKTOTAL, CKMB, CKMBINDEX, TROPONINI,  in the last 168 hours  BNP (last 3 results) No results found for this basename: PROBNP,  in the last 8760 hours CBG: No results found for this basename: GLUCAP,  in the last 168 hours  Radiological Exams on Admission: Dg Chest 2 View  07/07/2013   CLINICAL DATA:  Chest pain  EXAM: CHEST  2 VIEW  COMPARISON:  DG CHEST 2 VIEW dated 06/25/2013  FINDINGS: The heart size and mediastinal contours are within normal limits. Both lungs are clear. The visualized skeletal structures are unremarkable.  IMPRESSION: No active cardiopulmonary disease.   Electronically Signed   By: Elige KoHetal  Patel   On: 07/07/2013 14:16   Ct Angio Chest Pe W/cm &/or Wo Cm  07/07/2013   CLINICAL DATA:  Left-sided chest pain with shortness of breath. Question pulmonary embolism.  EXAM: CT ANGIOGRAPHY CHEST WITH CONTRAST  TECHNIQUE: Multidetector CT imaging of the chest was performed using the standard protocol during bolus administration of intravenous contrast. Multiplanar CT image reconstructions and MIPs were obtained to evaluate the vascular anatomy.  CONTRAST:  100mL  OMNIPAQUE IOHEXOL 350 MG/ML SOLN  COMPARISON:  DG CHEST 2 VIEW dated 07/07/2013; DG CHEST 2 VIEW dated 06/25/2013; CT CHEST W/O CM dated 05/25/2008  FINDINGS: The pulmonary arteries are well opacified with contrast. There is no evidence of acute pulmonary embolism. Atherosclerosis of the aorta, coronary arteries and great vessels is noted. The heart size is stable. There are scattered small mediastinal lymph nodes which are unchanged.  The previously demonstrated right pleural effusion has resolved. There is a small dependent left pleural effusion. There is no pericardial effusion. Mild dependent left lower lobe atelectasis is similar to the prior study. The right lung base is now clear. No airspace disease or endobronchial lesion is demonstrated. There is stable mild emphysema.  The visualized upper abdomen has a stable appearance. There are no worrisome osseous findings.  Review of the MIP images confirms the above findings.  IMPRESSION: Button  1. No evidence of acute pulmonary embolism. 2. Interval resolution of right pleural effusion and right basilar airspace disease. There is a small left pleural effusion with adjacent left lower lobe atelectasis. 3. Diffuse atherosclerosis.   Electronically Signed   By: Roxy HorsemanBill  Veazey M.D.   On: 07/07/2013 15:45    EKG: Independently reviewed.   Assessment/Plan Principal Problem:   Atypical  chest pain Active Problems:   Pleurisy   Leukocytosis   AKI (acute kidney injury)   HTN (hypertension)   S/P left hip revision   Chest pain, atypical   1. Atypical chest pain. Patient reporting sharp stabbing chest pain located in the left side of his chest worsened by deep inspiration. A CT scan of lungs with IV contrast performed in the emergency room not revealing evidence of acute pulmonary embolism. Acute infiltrates were not seen as well. Possibilities include atypical infection versus pleuritis. Will place patient in overnight observation with telemetry and cycle  troponin x3 sets. Provide empiric antibiotic therapy with a azithromycin 500 mg by mouth daily. 2. Leukocytosis. Initial lab work showing a white count in the 15,000 range. He presents nontoxic, afebrile, with CT that showed evidence of infiltrate. Given presentation of pleuritic type chest pain this could be secondary to viral infection or perhaps an atypical infection. Starting at age azithromycin 500 mg by mouth daily. 3. Hypertension. Patient having a blood pressure 110/73 in the emergency room, we'll hold antihypertensive agents for now. 4. Acute kidney injury. Lab work showing an elevated creatinine 1.4 with BUN of 27. Looking back at his records he had a creatinine 1.17 and BUN of 20 on 07/01/2013. This could be related to prerenal azotemia resulting from hydrochlorothiazide or decreased by mouth intake. Stopping diuretic therapy, provide IV fluids with normal saline at 75 mL per hour, followup on repeat BMP in a.m. 5. History of failed left total hip arthroplasty, status post revision. Patient recently discharged from the orthopedic service where he underwent revision of total left hip arthroplasty. Receiving physical therapy from home health services. 6. DVT prophylaxis. Heparin subcutaneous    Code Status: Full Code Family Communication: I spoke to patient's partner present at bedside Disposition Plan: Placing patient on 24-hour observation, cycling cardiac enzymes, providing gentle IV fluid hydration, do not anticipate him requiring greater than 2 night hospitalization  Time spent: 55 min  Jeralyn Bennett [facility] Pager [phone]

## 2013-07-08 DIAGNOSIS — J189 Pneumonia, unspecified organism: Secondary | ICD-10-CM

## 2013-07-08 LAB — CBC
HCT: 36.4 % — ABNORMAL LOW (ref 39.0–52.0)
Hemoglobin: 12.7 g/dL — ABNORMAL LOW (ref 13.0–17.0)
MCH: 31.8 pg (ref 26.0–34.0)
MCHC: 34.9 g/dL (ref 30.0–36.0)
MCV: 91.2 fL (ref 78.0–100.0)
Platelets: 267 10*3/uL (ref 150–400)
RBC: 3.99 MIL/uL — ABNORMAL LOW (ref 4.22–5.81)
RDW: 14.1 % (ref 11.5–15.5)
WBC: 11.7 10*3/uL — AB (ref 4.0–10.5)

## 2013-07-08 LAB — BASIC METABOLIC PANEL
BUN: 26 mg/dL — ABNORMAL HIGH (ref 6–23)
CO2: 21 mEq/L (ref 19–32)
CREATININE: 1.11 mg/dL (ref 0.50–1.35)
Calcium: 8.8 mg/dL (ref 8.4–10.5)
Chloride: 98 mEq/L (ref 96–112)
GFR, EST AFRICAN AMERICAN: 75 mL/min — AB (ref >90)
GFR, EST NON AFRICAN AMERICAN: 65 mL/min — AB (ref >90)
Glucose, Bld: 159 mg/dL — ABNORMAL HIGH (ref 70–99)
Potassium: 4.4 mEq/L (ref 3.7–5.3)
Sodium: 135 mEq/L — ABNORMAL LOW (ref 137–147)

## 2013-07-08 LAB — TROPONIN I: Troponin I: <0.3 ng/mL (ref <0.30)

## 2013-07-08 LAB — INFLUENZA PANEL BY PCR (TYPE A & B)
H1N1FLUPCR: NOT DETECTED
INFLBPCR: NEGATIVE
Influenza A By PCR: NEGATIVE

## 2013-07-08 MED ORDER — METHOCARBAMOL 500 MG PO TABS: 500.0000 mg | ORAL_TABLET | Freq: Three times a day (TID) | ORAL | Status: DC | PRN

## 2013-07-08 MED ORDER — MORPHINE SULFATE 2 MG/ML IJ SOLN
2.0000 mg | INTRAMUSCULAR | Status: DC | PRN
  Administered 2013-07-08 – 2013-07-09 (×2): 2 mg via INTRAVENOUS
  Filled 2013-07-08 (×2): qty 1

## 2013-07-08 NOTE — Progress Notes (Signed)
UR completed. Patient changed to inpatient- requiring IVF @ 75cc/hr 

## 2013-07-08 NOTE — Progress Notes (Signed)
This morning, patient's vital signs were checked, and the patient's oxygen sat. Was 83% on room air. Patient alert and oriented times 4. Patient still complaining of pain in the left side of his chest, which he states his worse when he breaths in deep. Patient put on 2L of O2 and oxygen sat. Went up to 92%. NP notified and new orders given for O2. Will continue to monitor patient.

## 2013-07-08 NOTE — Progress Notes (Signed)
PROGRESS NOTE  Pricilla RiffleJames H Skalski ZOX:[id number] DOB: 1940-10-06 DOA: 07/07/2013 PCP: Tonye PearsonOLITTLE, ROBERT P, MD  Assessment/Plan: Left sided chest pain - CTPA negative for PE, CE negative x 3. His pain is pleuritic and he does have left sided small pleural effusion ?in the setting of infectious process? CXR clear, agree with Azithromycin per admission impression; check influenza as well.  - Robaxin - patient needing oxygen this morning, desatting on RA but breathing very shallow due to pain. If persistent, consider repeat CXR.  Leukocytosis - improving.  HTN - continue to hold antohypertensives AKI - improving with fluids.  History of failed left total hip arthroplasty, status post revision. Patient recently discharged from the orthopedic service where he underwent revision of total left hip arthroplasty. Receiving physical therapy from home health services.  Diet: heart Fluids: NS DVT Prophylaxis: heparin  Code Status: Full Family Communication: none  Disposition Plan: inpatient, home when ready  Consultants:  none  Procedures:  none   Antibiotics  Anti-infectives   Start     Dose/Rate Route Frequency Ordered Stop   07/09/13 1000  azithromycin (ZITHROMAX) tablet 250 mg     250 mg Oral Daily 07/07/13 1835 07/13/13 0959   07/08/13 1000  azithromycin (ZITHROMAX) tablet 500 mg     500 mg Oral Daily 07/07/13 1835 07/08/13 1032   07/07/13 1615  cefTRIAXone (ROCEPHIN) 1 g in dextrose 5 % 50 mL IVPB     1 g 100 mL/hr over 30 Minutes Intravenous  Once 07/07/13 1604 07/07/13 1900   07/07/13 1615  azithromycin (ZITHROMAX) tablet 500 mg     500 mg Oral  Once 07/07/13 1604 07/07/13 1706     Antibiotics Given (last 72 hours)   Date/Time Action Medication Dose   07/07/13 1706 Given   azithromycin (ZITHROMAX) tablet 500 mg 500 mg   07/08/13 1032 Given   azithromycin (ZITHROMAX) tablet 500 mg 500 mg      HPI/Subjective: - complains of severe left sided chest pain with every  breath.   Objective: Filed Vitals:   07/08/13 0450 07/08/13 0457 07/08/13 0504 07/08/13 0551  BP:   161/92 138/82  Pulse:   90   Temp:   98.2 F (36.8 C)   TempSrc:   Oral   Resp:   18   Height:      Weight:      SpO2: 83% 92% 91% 93%    Intake/Output Summary (Last 24 hours) at 07/08/13 1405 Last data filed at 07/08/13 1014  Gross per 24 hour  Intake 1001.25 ml  Output    500 ml  Net 501.25 ml   Filed Weights   07/07/13 1810  Weight: 86.909 kg (191 lb 9.6 oz)    Exam:   General:  NAD  Cardiovascular: regular rate and rhythm, without MRG  Respiratory: shallow breaths, no wheezing  Abdomen: soft, not tender to palpation, positive bowel sounds  MSK: no peripheral edema  Neuro: CN 2-12 grossly intact, MS 5/5 in all 4  Data Reviewed: Basic Metabolic Panel:  Recent Labs Lab 07/07/13 1415 07/07/13 1916 07/08/13 0725  NA 136*  --  135*  K 3.8  --  4.4  CL 98  --  98  CO2  --   --  21  GLUCOSE 164*  --  159*  BUN 27*  --  26*  CREATININE 1.40* 1.30 1.11  CALCIUM  --   --  8.8   Liver Function Tests: No results found for this basename: AST,  ALT, ALKPHOS, BILITOT, PROT, ALBUMIN,  in the last 168 hours No results found for this basename: LIPASE, AMYLASE,  in the last 168 hours No results found for this basename: AMMONIA,  in the last 168 hours CBC:  Recent Labs Lab 07/07/13 1408 07/07/13 1415 07/07/13 1916 07/08/13 0725  WBC 15.4*  --  13.5* 11.7*  NEUTROABS 11.5*  --   --   --   HGB 13.1 13.9 12.2* 12.7*  HCT 37.7* 41.0 35.5* 36.4*  MCV 91.5  --  91.5 91.2  PLT 282  --  271 267   Cardiac Enzymes:  Recent Labs Lab 07/07/13 1916 07/08/13 0030 07/08/13 0725  TROPONINI <0.30 <0.30 <0.30   BNP (last 3 results) No results found for this basename: PROBNP,  in the last 8760 hours CBG: No results found for this basename: GLUCAP,  in the last 168 hours  No results found for this or any previous visit (from the past 240 hour(s)).   Studies: Dg  Chest 2 View  07/07/2013   CLINICAL DATA:  Chest pain  EXAM: CHEST  2 VIEW  COMPARISON:  DG CHEST 2 VIEW dated 06/25/2013  FINDINGS: The heart size and mediastinal contours are within normal limits. Both lungs are clear. The visualized skeletal structures are unremarkable.  IMPRESSION: No active cardiopulmonary disease.   Electronically Signed   By: Elige Ko   On: 07/07/2013 14:16   Ct Angio Chest Pe W/cm &/or Wo Cm  07/07/2013   CLINICAL DATA:  Left-sided chest pain with shortness of breath. Question pulmonary embolism.  EXAM: CT ANGIOGRAPHY CHEST WITH CONTRAST  TECHNIQUE: Multidetector CT imaging of the chest was performed using the standard protocol during bolus administration of intravenous contrast. Multiplanar CT image reconstructions and MIPs were obtained to evaluate the vascular anatomy.  CONTRAST:  OMNIPAQUE IOHEXOL 350 MG/ML SOLN  COMPARISON:  DG CHEST 2 VIEW dated 07/07/2013; DG CHEST 2 VIEW dated 06/25/2013; CT CHEST W/O CM dated 05/25/2008  FINDINGS: The pulmonary arteries are well opacified with contrast. There is no evidence of acute pulmonary embolism. Atherosclerosis of the aorta, coronary arteries and great vessels is noted. The heart size is stable. There are scattered small mediastinal lymph nodes which are unchanged.  The previously demonstrated right pleural effusion has resolved. There is a small dependent left pleural effusion. There is no pericardial effusion. Mild dependent left lower lobe atelectasis is similar to the prior study. The right lung base is now clear. No airspace disease or endobronchial lesion is demonstrated. There is stable mild emphysema.  The visualized upper abdomen has a stable appearance. There are no worrisome osseous findings.  Review of the MIP images confirms the above findings.  IMPRESSION: Button  1. No evidence of acute pulmonary embolism. 2. Interval resolution of right pleural effusion and right basilar airspace disease. There is a small left pleural  effusion with adjacent left lower lobe atelectasis. 3. Diffuse atherosclerosis.   Electronically Signed   By: Roxy Horseman M.D.   On: 07/07/2013 15:45    Scheduled Meds: . aspirin EC  325 mg Oral BID  . [START ON 07/09/2013] azithromycin  250 mg Oral Daily  . docusate sodium  100 mg Oral BID  . heparin  5,000 Units Subcutaneous 3 times per day  . polyethylene glycol  17 g Oral BID  . simvastatin  20 mg Oral QHS  . sodium chloride  3 mL Intravenous Q12H   Continuous Infusions: . sodium chloride 75 mL/hr at 07/07/13 1951  Principal Problem:   Atypical chest pain Active Problems:   HTN (hypertension)   S/P left hip revision   Pleurisy   Leukocytosis   Chest pain, atypical   AKI (acute kidney injury)   Time spent: 35  This note has been created with Education officer, environmental. Any transcriptional errors are unintentional.   Pamella Pert, MD [facility] Pager [phone]. If 7 PM - 7 AM, please contact night-coverage at [email or web address], password [facility] 07/08/2013, 2:05 PM  LOS: 1 day

## 2013-07-09 ENCOUNTER — Telehealth: Payer: Self-pay

## 2013-07-09 DIAGNOSIS — N179 Acute kidney failure, unspecified: Secondary | ICD-10-CM

## 2013-07-09 DIAGNOSIS — D500 Iron deficiency anemia secondary to blood loss (chronic): Secondary | ICD-10-CM

## 2013-07-09 LAB — CBC
HCT: 35.4 % — ABNORMAL LOW (ref 39.0–52.0)
Hemoglobin: 11.8 g/dL — ABNORMAL LOW (ref 13.0–17.0)
MCH: 30.9 pg (ref 26.0–34.0)
MCHC: 33.3 g/dL (ref 30.0–36.0)
MCV: 92.7 fL (ref 78.0–100.0)
Platelets: 299 10*3/uL (ref 150–400)
RBC: 3.82 MIL/uL — ABNORMAL LOW (ref 4.22–5.81)
RDW: 14.3 % (ref 11.5–15.5)
WBC: 10.7 10*3/uL — ABNORMAL HIGH (ref 4.0–10.5)

## 2013-07-09 LAB — BASIC METABOLIC PANEL
BUN: 21 mg/dL (ref 6–23)
CALCIUM: 8.9 mg/dL (ref 8.4–10.5)
CO2: 23 mEq/L (ref 19–32)
Chloride: 101 mEq/L (ref 96–112)
Creatinine, Ser: 0.91 mg/dL (ref 0.50–1.35)
GFR, EST NON AFRICAN AMERICAN: 83 mL/min — AB (ref >90)
Glucose, Bld: 154 mg/dL — ABNORMAL HIGH (ref 70–99)
POTASSIUM: 4.2 meq/L (ref 3.7–5.3)
Sodium: 136 mEq/L — ABNORMAL LOW (ref 137–147)

## 2013-07-09 MED ORDER — LISINOPRIL 20 MG PO TABS
20.0000 mg | ORAL_TABLET | Freq: Every day | ORAL | Status: DC
  Administered 2013-07-09 – 2013-07-11 (×3): 20 mg via ORAL
  Filled 2013-07-09 (×3): qty 1

## 2013-07-09 NOTE — Telephone Encounter (Signed)
Pt is needing to talk with dr Merla Richesdoolittle about his recent trip to the hospital

## 2013-07-09 NOTE — Progress Notes (Signed)
At 1900, patient stated his chest pain was a 10 out of 10, patient teary. He had already gotten oxy at 1800 and morphine IV was not due yet. NP was notified and new orders were given to increase the morphine IV frequency to every 3 hours PRN. Will continue to monitor.

## 2013-07-09 NOTE — Progress Notes (Signed)
Patient ID: Steven Paul, male   DOB: 08/27/1940, 73 y.o.   MRN: [id number]  [facility] PROGRESS NOTE  Steven Paul [id number] DOB: 08/27/1940 DOA: 07/07/2013 PCP: Tonye PearsonOLITTLE, ROBERT P, MD  Brief narrative: 73 y.o. 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 he tolerated the procedure well as there were no immediate complications during that hospitalization. He was discharged on the following day on 07/01/2013 to his home with home health services providing home PT. Patient had been doing well at home until he experienced sudden onset right-sided chest pain characterized as sharp, stabbing, precipitated by taking deep inspirations. He reported associated cough characterized as nonproductive. he denied associated fevers, chills, nausea, vomiting, malaise, generalized weakness, recent travel or sick contacts. He reports that on the field he had low blood pressure in the 70s, per ER documentation, he presented with a blood pressure 110/73, temp of 99.1, respiratory rate of 14 with an O2 sat of 90-94% on room air. Initial chest x-ray showed no acute cardiopulmonary disease. He was further worked up with a CT scan with IV contrast in the emergency room which did not show evidence of acute pulmonary embolism. Labs did reveal an elevated white count of 15,000.   Principal Problem:   Atypical chest pain - clinically improving bur still present mostly with movement and deep breaths - this is most likely secondary to left sided effusion with atelectasis - continue Zithromax and will consider trial of NSAID's Active Problems:   HTN (hypertension) - SBP in 160's - continue Lisinopril and adjust the dose as indicated    S/P left hip revision - continue analgesia as needed - PT evaluation    Leukocytosis - WBC continuing to trend  down   Acute kidney failure - secondary to pre renal etiology - IVF provided and pt responded well - Cr is now WNL   DVT Prophylaxis: heparin   Code Status: Full  Family Communication: son at bedside  Disposition Plan: inpatient, home in AM  Consultants:  None  Procedures:  None  Antibiotics:  Zithromax   HPI/Subjective: No events overnight.   Objective: Filed Vitals:   07/08/13 1956 07/09/13 0433 07/09/13 0506 07/09/13 1445  BP: 163/79 194/95 156/96 163/85  Pulse: 81 97  94  Temp: 98.2 F (36.8 C) 99.9 F (37.7 C)  98.9 F (37.2 C)  TempSrc: Oral Oral  Oral  Resp: 18 18  20   Height:      Weight:      SpO2: 95% 95%  99%    Intake/Output Summary (Last 24 hours) at 07/09/13 1733 Last data filed at 07/09/13 1000  Gross per 24 hour  Intake   2280 ml  Output    875 ml  Net   1405 ml    Exam:   General:  Pt is alert, follows commands appropriately, not in acute distress  Cardiovascular: Regular rate and rhythm, S1/S2, no murmurs, no rubs, no gallops  Respiratory: Clear to auscultation bilaterally, no wheezing, no crackles, no rhonchi  Abdomen: Soft, non tender, non distended, bowel sounds present, no guarding  Extremities: No edema, pulses DP and PT palpable bilaterally  Neuro: Grossly nonfocal  Data Reviewed: Basic Metabolic Panel:  Recent Labs Lab 07/07/13 1415 07/07/13 1916 07/08/13 0725 07/09/13 0424  NA 136*  --  135* 136*  K 3.8  --  4.4 4.2  CL 98  --  98 101  CO2  --   --  21 23  GLUCOSE 164*  --  159* 154*  BUN 27*  --  26* 21  CREATININE 1.40* 1.30 1.11 0.91  CALCIUM  --   --  8.8 8.9   CBC:  Recent Labs Lab 07/07/13 1408 07/07/13 1415 07/07/13 1916 07/08/13 0725 07/09/13 0424  WBC 15.4*  --  13.5* 11.7* 10.7*  NEUTROABS 11.5*  --   --   --   --   HGB 13.1 13.9 12.2* 12.7* 11.8*  HCT 37.7* 41.0 35.5* 36.4* 35.4*  MCV 91.5  --  91.5 91.2 92.7  PLT 282  --  271 267 299   Cardiac Enzymes:  Recent Labs Lab  07/07/13 1916 07/08/13 0030 07/08/13 0725  TROPONINI <0.30 <0.30 <0.30   Scheduled Meds: . aspirin EC  325 mg Oral BID  . azithromycin  250 mg Oral Daily  . docusate sodium  100 mg Oral BID  . heparin  5,000 Units Subcutaneous 3 times per day  . lisinopril  20 mg Oral Daily  . polyethylene glycol  17 g Oral BID  . simvastatin  20 mg Oral QHS  . sodium chloride  3 mL Intravenous Q12H   Continuous Infusions:    Debbora Presto, MD  [facility] Pager [phone]  If 7PM-7AM, please contact night-coverage [email or web address] Password [email or web address] 07/09/2013, 5:33 PM   LOS: 2 days

## 2013-07-10 ENCOUNTER — Inpatient Hospital Stay (HOSPITAL_COMMUNITY): Payer: PRIVATE HEALTH INSURANCE

## 2013-07-10 MED ORDER — INDOMETHACIN 25 MG PO CAPS
25.0000 mg | ORAL_CAPSULE | Freq: Two times a day (BID) | ORAL | Status: DC
  Administered 2013-07-10 – 2013-07-11 (×3): 25 mg via ORAL
  Filled 2013-07-10 (×7): qty 1

## 2013-07-10 MED ORDER — PREDNISONE 50 MG PO TABS
50.0000 mg | ORAL_TABLET | Freq: Every day | ORAL | Status: DC
  Administered 2013-07-10 – 2013-07-11 (×2): 50 mg via ORAL
  Filled 2013-07-10 (×5): qty 1

## 2013-07-10 NOTE — Evaluation (Signed)
Physical Therapy Evaluation Patient Details Name: Steven Paul MRN: [id number] DOB: [birth year] Today's Date: 07/10/2013 Time: 5284-1324 PT Time Calculation (min): 21 min  PT Assessment / Plan / Recommendation History of Present Illness  73 y.o. 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 he tolerated the procedure well as there were no immediate complications during that hospitalization. He was discharged on the following day on 07/01/2013 to his home with home health services providing home PT. Patient had been doing well at home until he experienced sudden onset right-sided chest pain characterized as sharp, stabbing, precipitated by taking deep inspirations. He reported associated cough characterized as nonproductive. he denied associated fevers, chills, nausea, vomiting, malaise, generalized weakness, recent travel or sick contacts. He reports that on the field he had low blood pressure in the 70s, per ER documentation, he presented with a blood pressure 110/73, temp of 99.1, respiratory rate of 14 with an O2 sat of 90-94% on room air. Initial chest x-ray showed no acute cardiopulmonary disease. He was further worked up with a CT scan with IV contrast in the emergency room which did not show evidence of acute pulmonary embolism. Labs did reveal an elevated white count of 15,000.   Clinical Impression  Pt admitted with above. Pt currently with functional limitations due to the deficits listed below (see PT Problem List).  Pt will benefit from skilled PT to increase their independence and safety with mobility to allow discharge to the venue listed below.  Pt reports difficulty with mobility upon return home not due to hip but from L sided chest/flank pain and fatigues quickly.  Pt only able to tolerate short distance in hallway with drop in  SpO2 to 83% on room air however quickly increased back into 90's with rest and breathing.     PT Assessment  Patient needs continued PT services    Follow Up Recommendations  Home health PT    Does the patient have the potential to tolerate intense rehabilitation      Barriers to Discharge        Equipment Recommendations  None recommended by PT    Recommendations for Other Services     Frequency Min 3X/week    Precautions / Restrictions Precautions Precautions: Posterior Hip Precaution Comments: monitor sats Restrictions LLE Weight Bearing: Weight bearing as tolerated   Pertinent Vitals/Pain SpO2 at rest on room air 97% SpO2 during ambulation on room air 83% Returned to room and reapplied 2L O2      Mobility  Bed Mobility Overal bed mobility: Needs Assistance Bed Mobility: Supine to Sit;Sit to Supine Supine to sit: Supervision Sit to supine: Min assist General bed mobility comments: pt self assisted L LE OOB with UEs, provided assist for L LE upon return to bed due to L flank pain and fatigue Transfers Overall transfer level: Needs assistance Equipment used: Rolling walker (2 wheeled) Transfers: Sit to/from Stand Sit to Stand: Min guard General transfer comment: pt did well with L LE placement to maintain precautions Ambulation/Gait Ambulation/Gait assistance: Min guard Ambulation Distance (Feet): 60 Feet (x2) Assistive device: Rolling walker (2 wheeled) Gait Pattern/deviations: Step-through pattern;Decreased step length - left;Antalgic General Gait Details: pt very limited by SOB, also states difficulty taking deep breaths due to pain in L flank, SpO2 did drop to 83% on room air however quickly increased with short rest break  after 60 feet, ambulated back to room and SpO2 again decreased to 84% upon return to room so reapplied O2 [place]    Exercises     PT Diagnosis: Difficulty walking  PT Problem List: Decreased strength;Decreased activity tolerance;Decreased  mobility;Cardiopulmonary status limiting activity;Decreased knowledge of use of DME;Pain PT Treatment Interventions: DME instruction;Gait training;Functional mobility training;Therapeutic activities;Therapeutic exercise;Patient/family education     PT Goals(Current goals can be found in the care plan section) Acute Rehab PT Goals PT Goal Formulation: With patient Time For Goal Achievement: 07/17/13 Potential to Achieve Goals: Good  Visit Information  Last PT Received On: 07/10/13 Assistance Needed: +1 History of Present Illness: 73 y.o. 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 he tolerated the procedure well as there were no immediate complications during that hospitalization. He was discharged on the following day on 07/01/2013 to his home with home health services providing home PT. Patient had been doing well at home until he experienced sudden onset right-sided chest pain characterized as sharp, stabbing, precipitated by taking deep inspirations. He reported associated cough characterized as nonproductive. he denied associated fevers, chills, nausea, vomiting, malaise, generalized weakness, recent travel or sick contacts. He reports that on the field he had low blood pressure in the 70s, per ER documentation, he presented with a blood pressure 110/73, temp of 99.1, respiratory rate of 14 with an O2 sat of 90-94% on room air. Initial chest x-ray showed no acute cardiopulmonary disease. He was further worked up with a CT scan with IV contrast in the emergency room which did not show evidence of acute pulmonary embolism. Labs did reveal an elevated white count of 15,000.        Prior Functioning  Home Living Family/patient expects to be discharged to:: Private residence Living Arrangements: Other (Comment);Spouse/significant  other Available Help at Discharge: Family Type of Home: House Home Access: Stairs to enter Entergy CorporationEntrance Stairs-Number of Steps: 4 Entrance Stairs-Rails: None Home Layout: One level Home Equipment: Environmental consultantWalker - 2 wheels;Cane - single point Prior Function Level of Independence: Independent Communication Communication: No difficulties    Cognition  Cognition Arousal/Alertness: Awake/alert Behavior During Therapy: WFL for tasks assessed/performed Overall Cognitive Status: Within Functional Limits for tasks assessed    Extremity/Trunk Assessment Lower Extremity Assessment Lower Extremity Assessment: LLE deficits/detail LLE Deficits / Details: AAROM WFL pt able to move against gravity, pt reports L hip still sore and requires self assist using UEs for bed mobility   Balance    End of Session PT - End of Session Equipment Utilized During Treatment: Oxygen Activity Tolerance: Patient limited by fatigue Patient left: in bed;with call bell/phone within reach;with family/visitor present  GP     Misbah Hornaday,KATHrine E 07/10/2013, 12:19 PM Zenovia JarredKati Olean Sangster, PT, DPT 07/10/2013 Pager: [phone]

## 2013-07-10 NOTE — Consult Note (Signed)
Name: Steven Paul MRN: [id number] DOB: [birth year]    ADMISSION DATE:  07/07/2013 CONSULTATION DATE:  07/10/2013  REFERRING MD :  DR Izola Price PRIMARY SERVICE:  [facility]  CHIEF COMPLAINT:  Dyspnea  BRIEF PATIENT DESCRIPTION: 73 year old male with recent history of total left hip arthoplasty (2/19). Presented 3/2 with sharp R sided chest pain, dyspnea and cough. PCCM asked to see for dyspnea.  SIGNIFICANT EVENTS / STUDIES:  3/2 admitted to [facility] 3/2 CTA chest - negative for PE  LINES / TUBES: PIV  CULTURES: None   ANTIBIOTICS: Azithro 3/4 >>>  HISTORY OF PRESENT ILLNESS:  73 year old male with PMH as below, which includes a failed left hip arthoplasty with metalosis. He was admitted for revision of the arthoplasty 2/19. he tolerated the procedure well as there were no immediate complications during that hospitalization. He was discharged on 02/24 to home. Patient had been doing well at home until 2:00 this 3/2 when he experienced sudden onset right-sided chest pain characterized as sharp, stabbing, precipitated by taking deep inspirations. He reported associated cough characterized as nonproductive. he denied associated fevers, chills, nausea, vomiting, malaise, generalized weakness, recent travel or sick contacts. Chest xray and CTA chest were both negative for cardiopulmonary disease. He reports that he is unable to walk to the nurses station without needing to take a break. At baseline he is a pretty healthy guy and is very active. He is frustrated with the course his health has taken since his last discharge. For me, he complains of left sided chest pain located just above the flank. Only present with breathing, and the pain definitely limits his respirations.    PAST MEDICAL HISTORY :  Past Medical History  Diagnosis Date  . Hypertension   . Hyperlipidemia   . Internal hemorrhoids   . Pneumonia 05/2008  . Tinnitus     both ears all the time  . Arthritis    Past Surgical History    Procedure Laterality Date  . Tonsillectomy and adenoidectomy  as child  . 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 Total hip arthroplasty  02/06/2012    Procedure: TOTAL HIP ARTHROPLASTY ANTERIOR APPROACH;  Surgeon: [name], MD;  Location: [facility] ORS;  Service: Orthopedics;  Laterality: Right;  . Total hip revision Left 06/30/2013    Procedure: LEFT TOTAL HIP REVISION;  Surgeon: [name], MD;  Location: [facility] ORS;  Service: Orthopedics;  Laterality: Left;   Prior to Admission medications   Medication Sig Start Date End Date Taking? Authorizing Provider  aspirin EC 325 MG EC tablet Take 1 tablet (325 mg total) by mouth 2 (two) times daily. 07/01/13 07/29/13 Yes Genelle Gather Babish, PA-C  docusate sodium 100 MG CAPS Take 100 mg by mouth 2 (two) times daily. 07/01/13  Yes Genelle Gather Babish, PA-C  ferrous sulfate 325 (65 FE) MG tablet Take 1 tablet (325 mg total) by mouth 3 (three) times daily after meals. 07/01/13  Yes Genelle Gather Babish, PA-C  HYDROcodone-acetaminophen (NORCO) 7.5-325 MG per tablet Take 1-2 tablets by mouth every 4 (four) hours as needed for moderate pain. 07/01/13  Yes Genelle Gather Babish, PA-C  lisinopril-hydrochlorothiazide (PRINZIDE,ZESTORETIC) 20-12.5 MG per tablet Take 1 tablet by mouth every morning. 06/04/13  Yes Tonye Pearson, MD  polyethylene glycol [facility] / GLYCOLAX) packet Take 17 g by mouth 2 (two) times daily. 07/01/13  Yes Genelle Gather Babish, PA-C  simvastatin (ZOCOR) 20 MG tablet Take 20 mg  by mouth at bedtime.  06/04/13  Yes Tonye Pearsonobert P Doolittle, MD  tiZANidine (ZANAFLEX) 4 MG capsule Take 1 capsule (4 mg total) by mouth 3 (three) times daily as needed for muscle spasms. 07/01/13  Yes Genelle GatherMatthew Scott Babish, PA-C   No Known Allergies  FAMILY HISTORY:  Family History  Problem Relation Age of Onset  . Heart disease Mother   . Heart disease Father    SOCIAL HISTORY:  reports that he has never smoked. He has never used smokeless tobacco. He reports  that he drinks alcohol. He reports that he does not use illicit drugs.  REVIEW OF SYSTEMS:     Bolds are positive  Constitutional: weight loss, gain, night sweats, Fevers, chills, fatigue .  HEENT: headaches, Sore throat, sneezing, nasal congestion, post nasal drip, Difficulty swallowing, Tooth/dental problems, visual complaints visual changes, ear ache CV:  chest pain, radiates: ,Orthopnea, PND, swelling in lower extremities, dizziness, palpitations, syncope.  GI  heartburn, indigestion, abdominal pain, nausea, vomiting, diarrhea, change in bowel habits, loss of appetite, bloody stools.  Resp: cough, productive: , hemoptysis, dyspnea, chest pain, pleuritic left chest/flank.  Skin: rash or itching or icterus GU: dysuria, change in color of urine, urgency or frequency. flank pain, hematuria  MS: joint pain or swelling. decreased range of motion  Psych: change in mood or affect. depression or anxiety.  Neuro: difficulty with speech, weakness, numbness, ataxia    SUBJECTIVE:   VITAL SIGNS:  Temp:  [98.6 F (37 C)-99.3 F (37.4 C)] 98.6 F (37 C) (03/05 0535) Pulse Rate:  [63-94] 63 (03/05 0535) Resp:  [18-20] 20 (03/05 0535) BP: (159-163)/(84-96) 159/96 mmHg (03/05 0535) SpO2:  [95 %-99 %] 98 % (03/05 0535)  PHYSICAL EXAMINATION: General:  Overweight male in NAD on 2L/min via [place].  Neuro:  Alert, oriented HEENT:  [place]/AT, no JVD noted Cardiovascular:  RRR Lungs:  Diminished bibasilar Abdomen:  Soft, non-tender Musculoskeletal:  Intact, no acute deformity Skin:  intact   Recent Labs Lab 07/07/13 1415 07/07/13 1916 07/08/13 0725 07/09/13 0424  NA 136*  --  135* 136*  K 3.8  --  4.4 4.2  CL 98  --  98 101  CO2  --   --  21 23  BUN 27*  --  26* 21  CREATININE 1.40* 1.30 1.11 0.91  GLUCOSE 164*  --  159* 154*    Recent Labs Lab 07/07/13 1916 07/08/13 0725 07/09/13 0424  HGB 12.2* 12.7* 11.8*  HCT 35.5* 36.4* 35.4*  WBC 13.5* 11.7* 10.7*  PLT 271 267 299   No  results found.  ASSESSMENT / PLAN:  Dyspnea - in setting of recent surgery most concern for PE, but CTA 3/3 was negative, also concern for PNA Left sided chest pain - not characteristic of ischemia, troponin negative 3/2 -> 3/3 Small left pleural effusion  Rec's: -Repeat CXR today to assess for PNA or larger effusion -If PNA will need HCAP coverage due to recent hospitalization -Consider sending D-dimer, but will likely be positive in setting of recent surgery -Consider venous doppler BLE , although CTA already negative.  -Pain management per primary team -Pleural effusion was small on CT with no need for intervention according to that study, will follow cxr.   Joneen RoachPaul Hoffman, ACNP [facility] Pager [phone] or [phone]  07/10/2013, 12:05 PM  Levy Pupaobert Daniele Yankowski, MD, PhD 07/11/2013, 1:09 PM  Pulmonary and Critical Care [phone] or if no answer [phone]

## 2013-07-10 NOTE — Progress Notes (Signed)
Patient ID: Pricilla RiffleJames H Kostelecky, male   DOB: 1941-01-13, 73 y.o.   MRN: [id number]  [facility] PROGRESS NOTE  Pricilla RiffleJames H Higbie WUJ:[id number] DOB: 1941-01-13 DOA: 07/07/2013 PCP: Tonye PearsonOLITTLE, ROBERT P, MD  Brief narrative:  73 y.o. 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 he tolerated the procedure well as there were no immediate complications during that hospitalization. He was discharged on the following day on 07/01/2013 to his home with home health services providing home PT. Patient had been doing well at home until he experienced sudden onset right-sided chest pain characterized as sharp, stabbing, precipitated by taking deep inspirations. He reported associated cough characterized as nonproductive. he denied associated fevers, chills, nausea, vomiting, malaise, generalized weakness, recent travel or sick contacts. He reports that on the field he had low blood pressure in the 70s, per ER documentation, he presented with a blood pressure 110/73, temp of 99.1, respiratory rate of 14 with an O2 sat of 90-94% on room air. Initial chest x-ray showed no acute cardiopulmonary disease. He was further worked up with a CT scan with IV contrast in the emergency room which did not show evidence of acute pulmonary embolism. Labs did reveal an elevated white count of 15,000.   Principal Problem:  Atypical chest pain, pleuritic   - still present mostly with movement and deep breaths  - etiology still unclear - PCCM consulted for additional recommendations  - pt placed on Prednisone and NSAID as recommended by Dr. Craige CottaSood  Active Problems:  HTN (hypertension)  - SBP in 160's  - continue Lisinopril and adjust the dose as indicated  S/P left hip revision  - continue analgesia as needed  - PT evaluation while inpatient  Leukocytosis  - WBC  continuing to trend down  Acute kidney failure  - secondary to pre renal etiology  - IVF provided and pt responded well  - Cr is now WNL   DVT Prophylaxis: heparin   Code Status: Full  Family Communication: son at bedside  Disposition Plan: inpatient, home in AM   Consultants:  None  Procedures:  None  Antibiotics:   Zithromax    HPI/Subjective: No events overnight.   Objective: Filed Vitals:   07/09/13 1445 07/09/13 2112 07/10/13 0535 07/10/13 1425  BP: 163/85 159/84 159/96 160/90  Pulse: 94 79 63 68  Temp: 98.9 F (37.2 C) 99.3 F (37.4 C) 98.6 F (37 C) 98.9 F (37.2 C)  TempSrc: Oral Oral Oral Oral  Resp: 20 18 20 20   Height:      Weight:      SpO2: 99% 95% 98% 99%    Intake/Output Summary (Last 24 hours) at 07/10/13 1630 Last data filed at 07/10/13 1425  Gross per 24 hour  Intake    240 ml  Output    500 ml  Net   -260 ml    Exam:   General:  Pt is alert, follows commands appropriately, not in acute distress  Cardiovascular: Regular rate and rhythm, S1/S2, no murmurs, no rubs, no gallops  Respiratory: Clear to auscultation bilaterally, no wheezing, diminished breath sounds at bases   Abdomen: Soft, non tender, non distended, bowel sounds present, no guarding  Extremities: No edema, pulses DP and PT palpable bilaterally  Neuro: Grossly nonfocal  Data Reviewed: Basic Metabolic Panel:  Recent Labs Lab 07/07/13 1415 07/07/13 1916 07/08/13 0725 07/09/13  0424  NA 136*  --  135* 136*  K 3.8  --  4.4 4.2  CL 98  --  98 101  CO2  --   --  21 23  GLUCOSE 164*  --  159* 154*  BUN 27*  --  26* 21  CREATININE 1.40* 1.30 1.11 0.91  CALCIUM  --   --  8.8 8.9   CBC:  Recent Labs Lab 07/07/13 1408 07/07/13 1415 07/07/13 1916 07/08/13 0725 07/09/13 0424  WBC 15.4*  --  13.5* 11.7* 10.7*  NEUTROABS 11.5*  --   --   --   --   HGB 13.1 13.9 12.2* 12.7* 11.8*  HCT 37.7* 41.0 35.5* 36.4* 35.4*  MCV 91.5  --  91.5 91.2 92.7  PLT 282  --   271 267 299   Cardiac Enzymes:  Recent Labs Lab 07/07/13 1916 07/08/13 0030 07/08/13 0725  TROPONINI <0.30 <0.30 <0.30   Scheduled Meds: . aspirin EC  325 mg Oral BID  . azithromycin  250 mg Oral Daily  . docusate sodium  100 mg Oral BID  . heparin  5,000 Units Subcutaneous 3 times per day  . indomethacin  25 mg Oral BID WC  . lisinopril  20 mg Oral Daily  . polyethylene glycol  17 g Oral BID  . predniSONE  50 mg Oral Q breakfast  . simvastatin  20 mg Oral QHS  . sodium chloride  3 mL Intravenous Q12H   Continuous Infusions:  Debbora Presto, MD  [facility] Pager [phone]  If 7PM-7AM, please contact night-coverage [email or web address] Password [email or web address] 07/10/2013, 4:30 PM   LOS: 3 days

## 2013-07-10 NOTE — Telephone Encounter (Signed)
FYI. Pt will be coming in to follow up with you post ED admission for pneumonia on Sunday around 10am.

## 2013-07-11 DIAGNOSIS — R091 Pleurisy: Secondary | ICD-10-CM

## 2013-07-11 LAB — PROCALCITONIN: Procalcitonin: <0.1 ng/mL

## 2013-07-11 MED ORDER — INDOMETHACIN 25 MG PO CAPS: 25.0000 mg | ORAL_CAPSULE | Freq: Two times a day (BID) | ORAL | Status: DC

## 2013-07-11 MED ORDER — AZITHROMYCIN 250 MG PO TABS: 250.0000 mg | ORAL_TABLET | Freq: Every day | ORAL | Status: DC

## 2013-07-11 MED ORDER — PREDNISONE 50 MG PO TABS: ORAL_TABLET | ORAL | Status: DC

## 2013-07-11 NOTE — Progress Notes (Signed)
Ambulated pt on RA with O2 stats ranging between 90-96%.  Pt denies SOB. Steven Paul

## 2013-07-11 NOTE — Discharge Instructions (Signed)
Pleurisy  Pleurisy is an inflammation and swelling of the lining of the lungs (pleura). Because of this inflammation, it hurts to breathe. It can be aggravated by coughing, laughing, or deep breathing. Pleurisy is often caused by an underlying infection or disease.   HOME CARE INSTRUCTIONS   Monitor your pleurisy for any changes. The following actions may help to alleviate any discomfort you are experiencing:   Medicine may help with pain. Only take over-the-counter or prescription medicines for pain, discomfort, or fever as directed by your health care provider.   Only take antibiotic medicine as directed. Make sure to finish it even if you start to feel better.  SEEK MEDICAL CARE IF:    Your pain is not controlled with medicine or is increasing.   You have an increase in pus-like (purulent) secretions brought up with coughing.  SEEK IMMEDIATE MEDICAL CARE IF:    You have blue or dark lips, fingernails, or toenails.   You are coughing up blood.   You have increased difficulty breathing.   You have continuing pain unrelieved by medicine or pain lasting more than 1 week.   You have pain that radiates into your neck, arms, or jaw.   You develop increased shortness of breath or wheezing.   You develop a fever, rash, vomiting, fainting, or other serious symptoms.  MAKE SURE YOU:   Understand these instructions.    Will watch your condition.    Will get help right away if you are not doing well or get worse.    Document Released: 04/24/2005 Document Revised: 12/25/2012 Document Reviewed: 10/06/2012  [employer] Patient Information 2014 [employer]

## 2013-07-11 NOTE — Discharge Summary (Signed)
Physician Discharge Summary  Steven PICKERAL ZOX:[id number] DOB: 1940-12-10 DOA: 07/07/2013  PCP: Tonye Pearson, MD  Admit date: 07/07/2013 Discharge date: 07/11/2013  Recommendations for Outpatient Follow-up:  1. Pt will need to follow up with PCP in 2-3 weeks post discharge 2. Please obtain BMP to evaluate electrolytes and kidney function 3. Please also check CBC to evaluate Hg and Hct levels 4. Please note that patient was discharged on indomethacin and prednisone taper pack to complete over 2 weeks. Patient was advised to followup with pulmonologist, his daughter explains she will schedule an appointment as her family members are following with [facility] pulmonology office 5.  patient also discharged on Zithromax   Discharge Diagnoses:  pleurisy  Principal Problem:   Atypical chest pain Active Problems:   HTN (hypertension)   S/P left hip revision   Pleurisy   Leukocytosis   Chest pain, atypical   AKI (acute kidney injury)   Discharge Condition: Stable  Diet recommendation: Heart healthy diet discussed in details   Brief narrative:  73 y.o. 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 he tolerated the procedure well as there were no immediate complications during that hospitalization. He was discharged on the following day on 07/01/2013 to his home with home health services providing home PT. Patient had been doing well at home until he experienced sudden onset right-sided chest pain characterized as sharp, stabbing, precipitated by taking deep inspirations. He reported associated cough characterized as nonproductive. he denied associated fevers, chills, nausea, vomiting, malaise, generalized weakness, recent travel or sick contacts. He reports that on the field he had low blood pressure in the 70s, per ER documentation, he  presented with a blood pressure 110/73, temp of 99.1, respiratory rate of 14 with an O2 sat of 90-94% on room air. Initial chest x-ray showed no acute cardiopulmonary disease. He was further worked up with a CT scan with IV contrast in the emergency room which did not show evidence of acute pulmonary embolism. Labs did reveal an elevated white count of 15,000.   Principal Problem:  Atypical chest pain, pleuritic  -  patient denies pleuritic pain this morning, reports feeling well and responding well to prednisone and indomethacin  - etiology still unclear and possibly related to presence of small amount of right-sided pleural effusion with atelectasis  - PCCM consulted for additional recommendations  - pt placed on Prednisone and NSAID as recommended by Dr. Craige Cotta for 2 weeks therapy  Active Problems:  HTN (hypertension)  - SBP in 160's  - continue Lisinopril and adjust the dose as indicated  S/P left hip revision  - continue analgesia as needed  - PT evaluation while inpatient  Leukocytosis  - WBC continuing to trend down  Acute kidney failure  - secondary to pre renal etiology  - IVF provided and pt responded well  - Cr is now WNL   DVT Prophylaxis: heparin  Code Status: Full  Family Communication: son at bedside   Consultants:  None  Procedures:  None  Antibiotics:  Zithromax   Discharge Exam: Filed Vitals:   07/11/13 0515  BP: 160/90  Pulse: 59  Temp: 98.3 F (36.8 C)  Resp: 20   Filed Vitals:   07/10/13 0535 07/10/13 1425 07/10/13 2035 07/11/13 0515  BP: 159/96 160/90 162/87 160/90  Pulse: 63 68 75 59  Temp: 98.6 F (  37 C) 98.9 F (37.2 C) 97.7 F (36.5 C) 98.3 F (36.8 C)  TempSrc: Oral Oral Oral Oral  Resp: 20 20 18 20   Height:      Weight:      SpO2: 98% 99% 97% 99%    General: Pt is alert, follows commands appropriately, not in acute distress Cardiovascular: Regular rate and rhythm, S1/S2 +, no murmurs, no rubs, no gallops Respiratory: Clear to  auscultation bilaterally, no wheezing, no crackles, no rhonchi Abdominal: Soft, non tender, non distended, bowel sounds +, no guarding Extremities: no edema, no cyanosis, pulses palpable bilaterally DP and PT Neuro: Grossly nonfocal  Discharge Instructions  Discharge Orders   Future Appointments Provider Department Dept Phone   12/03/2013 10:30 AM Tonye Pearsonobert P Doolittle, MD [facility] [phone]   Future Orders Complete By Expires   Diet - low sodium heart healthy  As directed    Increase activity slowly  As directed        Medication List         aspirin 325 MG EC tablet  Take 1 tablet (325 mg total) by mouth 2 (two) times daily.     azithromycin 250 MG tablet  Commonly known as:  ZITHROMAX  Take 1 tablet (250 mg total) by mouth daily.     DSS 100 MG Caps  Take 100 mg by mouth 2 (two) times daily.     ferrous sulfate 325 (65 FE) MG tablet  Take 1 tablet (325 mg total) by mouth 3 (three) times daily after meals.     HYDROcodone-acetaminophen 7.5-325 MG per tablet  Commonly known as:  NORCO  Take 1-2 tablets by mouth every 4 (four) hours as needed for moderate pain.     indomethacin 25 MG capsule  Commonly known as:  INDOCIN  Take 1 capsule (25 mg total) by mouth 2 (two) times daily with a meal.     lisinopril-hydrochlorothiazide 20-12.5 MG per tablet  Commonly known as:  PRINZIDE,ZESTORETIC  Take 1 tablet by mouth every morning.     polyethylene glycol packet  Commonly known as:  MIRALAX / GLYCOLAX  Take 17 g by mouth 2 (two) times daily.     predniSONE 50 MG tablet  Commonly known as:  DELTASONE  Take 50 mg tablet today and three more days, taper down by 5 mg daily until completed     simvastatin 20 MG tablet  Commonly known as:  ZOCOR  Take 20 mg by mouth at bedtime.     tiZANidine 4 MG capsule  Commonly known as:  ZANAFLEX  Take 1 capsule (4 mg total) by mouth 3 (three) times daily as needed for muscle spasms.           Follow-up  Information   Schedule an appointment as soon as possible for a visit with DOOLITTLE, Harrel LemonOBERT P, MD.   Specialties:  Internal Medicine, Adolescent Medicine   Contact information:   [address] [place] [address] [phone]       Follow up with Debbora PrestoMAGICK-MYERS, ISKRA, MD. (As needed, If symptoms worsen call my cell phone [phone])    Specialty:  Internal Medicine   Contact information:   [address] [place] [address] [phone]        The results of significant diagnostics from this hospitalization (including imaging, microbiology, ancillary and laboratory) are listed below for reference.     Microbiology: No results found for this or any previous visit (from the past 240 hour(s)).  Labs: Basic Metabolic Panel:  Recent Labs Lab 07/07/13 1415 07/07/13 1916 07/08/13 0725 07/09/13 0424  NA 136*  --  135* 136*  K 3.8  --  4.4 4.2  CL 98  --  98 101  CO2  --   --  21 23  GLUCOSE 164*  --  159* 154*  BUN 27*  --  26* 21  CREATININE 1.40* 1.30 1.11 0.91  CALCIUM  --   --  8.8 8.9   CBC:  Recent Labs Lab 07/07/13 1408 07/07/13 1415 07/07/13 1916 07/08/13 0725 07/09/13 0424  WBC 15.4*  --  13.5* 11.7* 10.7*  NEUTROABS 11.5*  --   --   --   --   HGB 13.1 13.9 12.2* 12.7* 11.8*  HCT 37.7* 41.0 35.5* 36.4* 35.4*  MCV 91.5  --  91.5 91.2 92.7  PLT 282  --  271 267 299   Cardiac Enzymes:  Recent Labs Lab 07/07/13 1916 07/08/13 0030 07/08/13 0725  TROPONINI <0.30 <0.30 <0.30   SIGNED: Time coordinating discharge: Over 30 minutes  Debbora Presto, MD  [facility] 07/11/2013, 11:17 AM Pager [phone]  If 7PM-7AM, please contact night-coverage [email or web address] Password [email or web address]

## 2013-07-11 NOTE — Progress Notes (Signed)
[facility].

## 2013-07-13 ENCOUNTER — Ambulatory Visit (INDEPENDENT_AMBULATORY_CARE_PROVIDER_SITE_OTHER): Payer: PRIVATE HEALTH INSURANCE | Admitting: Internal Medicine

## 2013-07-13 VITALS — BP 146/82 | HR 73 | Temp 97.9°F | Resp 16 | Ht 66.5 in | Wt 201.0 lb

## 2013-07-13 DIAGNOSIS — R0789 Other chest pain: Secondary | ICD-10-CM

## 2013-07-13 DIAGNOSIS — J189 Pneumonia, unspecified organism: Secondary | ICD-10-CM

## 2013-07-13 NOTE — Progress Notes (Signed)
   Subjective:    Patient ID: Steven Paul, male    DOB: 01/28/1941, 73 y.o.   MRN: [id number] This chart was scribed for Ellamae Siaobert Doolittle, MD by Nicholos Johnsenise Iheanachor, Medical Scribe. This patient's care was started at 11:40 AM.  HPI HPI Comments: Steven RiffleJames H Paul is a 73 y.o. male who [facility] follow up. Pt was admitted to the hospital on 07/07/13 for gradually worsening sharp, stabbing right sided chest pain and increased pain with deep breathing. Also reported associated non productive cough. Initial chest x-ray and CT scan did not show any acute cardiopulmonary diease or acute pulmonary embolism. Diagnosed with Pleurisy. Was prescribed Prednisone and Zithromax to which he reports provided significant relief. Was able to walk but with pain and difficulty deep breathing. Pt has a left total hip arthroplasty with metallosis on 06/26/13. Tolerated the procedure well. Discharged from the hospital 07/02/23.   Today states he feels fine. Tired because he is not sleeping well.   Review of Systems  Respiratory: Negative for chest tightness and shortness of breath.   Cardiovascular: Negative for chest pain.   Objective:  Physical Exam  Vitals reviewed. Constitutional: He is oriented to person, place, and time. He appears well-developed and well-nourished. No distress.  HENT:  Head: Normocephalic and atraumatic.  Eyes: EOM are normal.  Neck: Neck supple. No tracheal deviation present.  Cardiovascular: Normal rate, regular rhythm and normal heart sounds.   No murmur heard. Pulmonary/Chest: Effort normal and breath sounds normal. No respiratory distress.  Musculoskeletal: Normal range of motion.  Neurological: He is alert and oriented to person, place, and time.  Skin: Skin is warm and dry.  Psychiatric: He has a normal mood and affect. His behavior is normal.   Assessment & Plan:  I have completed the patient encounter in its entirety as documented by  the scribe, with editing by me where necessary. Robert P. Merla Richesoolittle, M.D. Chest pain, atypical--following hip replacement  CAP (community acquired pneumonia)--provisional diagnosis without radiological documentation/left pleural effusion only finding   He would advance activity as tolerated and recheck as planned

## 2013-12-03 ENCOUNTER — Encounter: Payer: Self-pay | Admitting: Internal Medicine

## 2013-12-03 ENCOUNTER — Ambulatory Visit (INDEPENDENT_AMBULATORY_CARE_PROVIDER_SITE_OTHER): Payer: PRIVATE HEALTH INSURANCE | Admitting: Internal Medicine

## 2013-12-03 VITALS — BP 143/97 | HR 70 | Temp 98.5°F | Resp 16 | Ht 66.0 in | Wt 196.0 lb

## 2013-12-03 DIAGNOSIS — R7309 Other abnormal glucose: Secondary | ICD-10-CM

## 2013-12-03 DIAGNOSIS — E785 Hyperlipidemia, unspecified: Secondary | ICD-10-CM

## 2013-12-03 DIAGNOSIS — R739 Hyperglycemia, unspecified: Secondary | ICD-10-CM

## 2013-12-03 DIAGNOSIS — D509 Iron deficiency anemia, unspecified: Secondary | ICD-10-CM

## 2013-12-03 DIAGNOSIS — I10 Essential (primary) hypertension: Secondary | ICD-10-CM

## 2013-12-03 LAB — LIPID PANEL
Cholesterol: 215 mg/dL — ABNORMAL HIGH (ref 0–200)
HDL: 42 mg/dL (ref >39)
LDL CALC: 120 mg/dL — AB (ref 0–99)
Total CHOL/HDL Ratio: 5.1 Ratio
Triglycerides: 263 mg/dL — ABNORMAL HIGH (ref <150)
VLDL: 53 mg/dL — ABNORMAL HIGH (ref 0–40)

## 2013-12-03 LAB — CBC WITH DIFFERENTIAL/PLATELET
BASOS ABS: 0.1 10*3/uL (ref 0.0–0.1)
Basophils Relative: 1 % (ref 0–1)
EOS PCT: 3 % (ref 0–5)
Eosinophils Absolute: 0.2 10*3/uL (ref 0.0–0.7)
HCT: 44.4 % (ref 39.0–52.0)
Hemoglobin: 15.3 g/dL (ref 13.0–17.0)
LYMPHS ABS: 2.4 10*3/uL (ref 0.7–4.0)
Lymphocytes Relative: 36 % (ref 12–46)
MCH: 30.3 pg (ref 26.0–34.0)
MCHC: 34.5 g/dL (ref 30.0–36.0)
MCV: 87.9 fL (ref 78.0–100.0)
Monocytes Absolute: 1 10*3/uL (ref 0.1–1.0)
Monocytes Relative: 15 % — ABNORMAL HIGH (ref 3–12)
NEUTROS PCT: 45 % (ref 43–77)
Neutro Abs: 3 10*3/uL (ref 1.7–7.7)
PLATELETS: 266 10*3/uL (ref 150–400)
RBC: 5.05 MIL/uL (ref 4.22–5.81)
RDW: 14.5 % (ref 11.5–15.5)
WBC: 6.7 10*3/uL (ref 4.0–10.5)

## 2013-12-03 LAB — COMPREHENSIVE METABOLIC PANEL
ALT: 65 U/L — AB (ref 0–53)
AST: 51 U/L — ABNORMAL HIGH (ref 0–37)
Albumin: 4.5 g/dL (ref 3.5–5.2)
Alkaline Phosphatase: 72 U/L (ref 39–117)
BUN: 21 mg/dL (ref 6–23)
CALCIUM: 9.7 mg/dL (ref 8.4–10.5)
CHLORIDE: 106 meq/L (ref 96–112)
CO2: 27 meq/L (ref 19–32)
Creat: 1.13 mg/dL (ref 0.50–1.35)
GLUCOSE: 147 mg/dL — AB (ref 70–99)
Potassium: 4.5 mEq/L (ref 3.5–5.3)
Sodium: 140 mEq/L (ref 135–145)
TOTAL PROTEIN: 6.7 g/dL (ref 6.0–8.3)
Total Bilirubin: 0.9 mg/dL (ref 0.2–1.2)

## 2013-12-03 LAB — POCT GLYCOSYLATED HEMOGLOBIN (HGB A1C): HEMOGLOBIN A1C: 6.5

## 2013-12-03 MED ORDER — LISINOPRIL-HYDROCHLOROTHIAZIDE 20-12.5 MG PO TABS: 1.0000 | ORAL_TABLET | Freq: Every morning | ORAL | Status: DC

## 2013-12-03 MED ORDER — SIMVASTATIN 20 MG PO TABS: 20.0000 mg | ORAL_TABLET | Freq: Every day | ORAL | Status: DC

## 2013-12-03 NOTE — Progress Notes (Signed)
Subjective:  This chart was scribed for Dr. Linton Ham. Laney Pastor, MD  by Stacy Gardner, [facility] Scribe. The patient was seen in room and the patient's care was started at 11:16 AM.  Chief Complaint  Patient presents with  . Medication Refill    Lisinoprill-HCTZ 20-12.5; Simvastatin 20 mg - Pt is fasting     Patient ID: Steven Paul, male    DOB: 09-29-1940, 73 y.o.   MRN: [id number]  12/03/2013  Medication Refill   HPI HPI Comments: [name] AGE is a 73 y.o. male  With hx of hyperglycemia, hyperlipemia, HTN, iron deficiency anemia, who [facility] for a medication refill for Lisinopril-HCTZ and Simvastin. Denies any side effects from taking his medications.  He has gained weight recently as he has retired from [employer], product/process development.  Pt does not walk as much post hip repl #2 in march but feels great pain relief  He brought a new 37'sailboat one month ago/has 3 others/ sails for exercise Plans to retire in 2 months and to live on his boat in [place] with his wife.   Patient Active Problem List . HTN (hypertension) 08/02/2011  . Hyperlipemia 08/02/2011  . Hyperglycemia 08/02/2011   . Overweight (BMI 25.0-29.9) 02/07/2012      Diagnosis Date Noted  . CAP (community acquired pneumonia) 07/07/2013  . Atypical chest pain 07/07/2013  . Pleurisy 07/07/2013  . Leukocytosis 07/07/2013  . Chest pain, atypical 07/07/2013  . AKI (acute kidney injury) 07/07/2013  . Obese 07/01/2013  . S/P left hip revision 06/30/2013      . Hyponatremia 02/07/2012  . Expected blood loss anemia 02/07/2012  . S/P right THA, AA 02/06/2012                Past Surgical History  Procedure Laterality Date  . Tonsillectomy and adenoidectomy  as child  . 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 Total hip arthroplasty  02/06/2012    Procedure: TOTAL HIP ARTHROPLASTY ANTERIOR APPROACH;  Surgeon: Mauri Pole, MD;  Location:  [facility];  Service: Orthopedics;  Laterality: Right;  . Total hip revision Left 06/30/2013    Procedure: LEFT TOTAL HIP REVISION;  Surgeon: Mauri Pole, MD;  Location: [facility];  Service: Orthopedics;  Laterality: Left;   No Known Allergies Prior to Admission medications   Medication Sig Start Date End Date Taking? Authorizing Provider  aspirin 81 MG tablet Take 81 mg by mouth daily.   Yes Historical Provider, MD  lisinopril-hydrochlorothiazide (PRINZIDE,ZESTORETIC) 20-12.5 MG per tablet Take 1 tablet by mouth every morning. 06/04/13  Yes Leandrew Koyanagi, MD  simvastatin (ZOCOR) 20 MG tablet Take 20 mg by mouth at bedtime.  06/04/13  Yes Leandrew Koyanagi, MD      Review of Systems  Constitutional: Negative for fever and fatigue.  HENT: Negative for hearing loss and trouble swallowing.   Eyes: Negative for visual disturbance.  Respiratory: Negative for chest tightness and shortness of breath.   Cardiovascular: Negative for chest pain, palpitations and leg swelling.  Gastrointestinal: Negative for abdominal pain.  Genitourinary: Negative for difficulty urinating.  Musculoskeletal:       Getting over a recent injury to his foot and doing well  Neurological: Negative for dizziness and headaches.  Hematological:       Postoperative anemia needs followup  Psychiatric/Behavioral: Negative for sleep disturbance and dysphoric mood.  All other systems reviewed  and are negative.  No Known Allergies      Objective:    BP 143/97  Pulse 70  Temp(Src) 98.5 F (36.9 C) (Oral)  Resp 16  Ht $R'5\' 6"'aD$  (1.676 m)  Wt 196 lb (88.905 kg)  BMI 31.65 kg/m2  SpO2 94%  Physical Exam  Nursing note and vitals reviewed. Constitutional: He is oriented to person, place, and time. He appears well-developed and well-nourished. No distress.  HENT:  Head: Normocephalic.  Eyes: Conjunctivae and EOM are normal. Pupils are equal, round, and reactive to light.  Neck: Normal range of motion. No thyromegaly  present.  Cardiovascular: Normal rate, regular rhythm and normal heart sounds.   No murmur heard. No carotid bruits   Pulmonary/Chest: Effort normal and breath sounds normal. No respiratory distress. He has no wheezes. He has no rales.  Musculoskeletal: Normal range of motion. He exhibits no edema.  Neurological: He is alert and oriented to person, place, and time. No cranial nerve deficit.  Psychiatric: He has a normal mood and affect. His behavior is normal.   Wt Readings from Last 3 Encounters:  12/03/13 196 lb (88.905 kg)  07/13/13 201 lb (91.173 kg)  07/07/13 191 lb 9.6 oz (86.909 kg)        Assessment & Plan:  Essential hypertension - Plan: Comprehensive metabolic panel  Other and unspecified hyperlipidemia - Plan: Lipid panel  Hyperglycemia - Plan: POCT glycosylated hemoglobin (Hb A1C)  Iron deficiency anemia-post surgical/hip repl - Plan: CBC with Differential  Overweight Results for orders placed in visit on 12/03/13  CBC WITH DIFFERENTIAL      Result Value Ref Range   WBC 6.7  4.0 - 10.5 K/uL   RBC 5.05  4.22 - 5.81 MIL/uL   Hemoglobin 15.3  13.0 - 17.0 g/dL   HCT 44.4  39.0 - 52.0 %   MCV 87.9  78.0 - 100.0 fL   MCH 30.3  26.0 - 34.0 pg   MCHC 34.5  30.0 - 36.0 g/dL   RDW 14.5  11.5 - 15.5 %   Platelets 266  150 - 400 K/uL   Neutrophils Relative % 45  43 - 77 %   Neutro Abs 3.0  1.7 - 7.7 K/uL   Lymphocytes Relative 36  12 - 46 %   Lymphs Abs 2.4  0.7 - 4.0 K/uL   Monocytes Relative 15 (*) 3 - 12 %   Monocytes Absolute 1.0  0.1 - 1.0 K/uL   Eosinophils Relative 3  0 - 5 %   Eosinophils Absolute 0.2  0.0 - 0.7 K/uL   Basophils Relative 1  0 - 1 %   Basophils Absolute 0.1  0.0 - 0.1 K/uL   Smear Review Criteria for review not met    LIPID PANEL      Result Value Ref Range   Cholesterol 215 (*) 0 - 200 mg/dL   Triglycerides 263 (*) <150 mg/dL   HDL 42  >39 mg/dL   Total CHOL/HDL Ratio 5.1     VLDL 53 (*) 0 - 40 mg/dL   LDL Cholesterol 120 (*) 0 - 99  mg/dL  COMPREHENSIVE METABOLIC PANEL      Result Value Ref Range   Sodium 140  135 - 145 mEq/L   Potassium 4.5  3.5 - 5.3 mEq/L   Chloride 106  96 - 112 mEq/L   CO2 27  19 - 32 mEq/L   Glucose, Bld 147 (*) 70 - 99 mg/dL   BUN 21  6 - 23 mg/dL   Creat 1.13  0.50 - 1.35 mg/dL   Total Bilirubin 0.9  0.2 - 1.2 mg/dL   Alkaline Phosphatase 72  39 - 117 U/L   AST 51 (*) 0 - 37 U/L   ALT 65 (*) 0 - 53 U/L   Total Protein 6.7  6.0 - 8.3 g/dL   Albumin 4.5  3.5 - 5.2 g/dL   Calcium 9.7  8.4 - 10.5 mg/dL  POCT GLYCOSYLATED HEMOGLOBIN (HGB A1C)      Result Value Ref Range   Hemoglobin A1C 6.5     Mildly abnormal LFTs present since 2010 though with other normal findings in between testing and was negative hep C antibody Hemoglobin A1c remains stable at showing 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 aspirin 81 MG tablet    Sig: Take 81 mg by mouth daily.  Marland Kitchen lisinopril-hydrochlorothiazide (PRINZIDE,ZESTORETIC) 20-12.5 MG per tablet    Sig: Take 1 tablet by mouth every morning.    Dispense:  90 tablet    Refill:  1  . simvastatin (ZOCOR) 20 MG tablet    Sig: Take 1 tablet (20 mg total) by mouth at bedtime.    Dispense:  90 tablet    Refill:  1   74mo I personally performed the services described in this documentation, which was scribed in my presence. The recorded information has been reviewed and is accurate.

## 2013-12-05 ENCOUNTER — Encounter: Payer: Self-pay | Admitting: Internal Medicine

## 2014-06-03 ENCOUNTER — Ambulatory Visit (INDEPENDENT_AMBULATORY_CARE_PROVIDER_SITE_OTHER): Payer: PRIVATE HEALTH INSURANCE | Admitting: Internal Medicine

## 2014-06-03 VITALS — BP 165/122 | HR 87 | Temp 97.6°F | Resp 16 | Ht 66.0 in | Wt 197.0 lb

## 2014-06-03 DIAGNOSIS — R739 Hyperglycemia, unspecified: Secondary | ICD-10-CM

## 2014-06-03 DIAGNOSIS — R7989 Other specified abnormal findings of blood chemistry: Secondary | ICD-10-CM | POA: Insufficient documentation

## 2014-06-03 DIAGNOSIS — R945 Abnormal results of liver function studies: Secondary | ICD-10-CM

## 2014-06-03 DIAGNOSIS — I10 Essential (primary) hypertension: Secondary | ICD-10-CM

## 2014-06-03 DIAGNOSIS — E785 Hyperlipidemia, unspecified: Secondary | ICD-10-CM

## 2014-06-03 MED ORDER — SIMVASTATIN 20 MG PO TABS: 20.0000 mg | ORAL_TABLET | Freq: Every day | ORAL | Status: DC

## 2014-06-03 MED ORDER — AMLODIPINE BESYLATE 5 MG PO TABS: 5.0000 mg | ORAL_TABLET | Freq: Every day | ORAL | Status: DC

## 2014-06-03 MED ORDER — LISINOPRIL-HYDROCHLOROTHIAZIDE 20-12.5 MG PO TABS: 1.0000 | ORAL_TABLET | Freq: Every morning | ORAL | Status: DC

## 2014-06-03 NOTE — Progress Notes (Signed)
   Subjective:    Patient ID: Pricilla RiffleJames H Swader, male    DOB: 09-Oct-1940, 74 y.o.   MRN: [id number]  HPIf/u Patient Active Problem List   Diagnosis Date Noted  . S/P left hip revision 06/30/2013  . Overweight (BMI 25.0-29.9) 02/07/2012  . S/P right THA, AA 02/06/2012  . HTN (hypertension) 08/02/2011  . Hyperlipemia 08/02/2011  . Hyperglycemia 08/02/2011   GF with broken foot  Granddaughter had baby at [facility]--b aby to [facility] w/ pneum last week  Waiting to [place] when foot well  No chg heal stat Feels good Weight unchged   Review of Systems  Constitutional: Negative for activity change, appetite change, fatigue and unexpected weight change.  HENT: Negative for trouble swallowing.   Eyes: Negative for visual disturbance.  Respiratory: Negative for shortness of breath.   Cardiovascular: Negative for chest pain, palpitations and leg swelling.  Gastrointestinal: Negative for abdominal pain.  Genitourinary: Negative for dysuria and difficulty urinating.  Neurological: Negative for dizziness and headaches.  Psychiatric/Behavioral: Negative for sleep disturbance and decreased concentration.       Objective:   Physical Exam  Constitutional: He is oriented to person, place, and time. He appears well-developed and well-nourished. No distress.  HENT:  Head: Normocephalic and atraumatic.  Eyes: Conjunctivae and EOM are normal. Pupils are equal, round, and reactive to light.  Neck: Normal range of motion. No thyromegaly present.  Cardiovascular: Normal rate, regular rhythm, normal heart sounds and intact distal pulses.   No murmur heard. No c bruits  Pulmonary/Chest: Effort normal and breath sounds normal. No respiratory distress.  Musculoskeletal: Normal range of motion. He exhibits no edema.  Lymphadenopathy:    He has no cervical adenopathy.  Neurological: He is alert and oriented to person, place, and time. No cranial nerve deficit.  Skin: Skin is warm and dry.    Psychiatric: He has a normal mood and affect. His behavior is normal. Judgment and thought content normal.  Nursing note and vitals reviewed.  BP 165/122 mmHg  Pulse 87  Temp(Src) 97.6 F (36.4 C)  Resp 16  Ht 5\' 6"  (1.676 m)  Wt 197 lb (89.359 kg)  BMI 31.81 kg/m2  SpO2 96%   Repeat BP manual 160/98     Assessment & Plan:  Abnormal LFTs  Essential hypertension-uncontrolled  Hyperlipemia  Hyperglycemia  Reviewed last labs suggesting weight is driving glucose intol, dyslipidemia and creating steastosis He will take this seriously and begin diet w/ wt loss and incr exer Rep lbs 6 mos Add norvasc and follow home BP  Meds ordered this encounter  Medications  . amLODipine (NORVASC) 5 MG tablet    Sig: Take 1 tablet (5 mg total) by mouth daily.    Dispense:  90 tablet    Refill:  3  . lisinopril-hydrochlorothiazide (PRINZIDE,ZESTORETIC) 20-12.5 MG per tablet    Sig: Take 1 tablet by mouth every morning.    Dispense:  90 tablet    Refill:  1  . simvastatin (ZOCOR) 20 MG tablet    Sig: Take 1 tablet (20 mg total) by mouth at bedtime.    Dispense:  90 tablet    Refill:  1

## 2014-10-27 IMAGING — CR DG PORTABLE PELVIS
1 series · 1 of 1 positions shown · non-contrast
Comparison: DG PELVIS PORTABLE dated 02/06/2012

CLINICAL DATA: postop

EXAM:
PORTABLE PELVIS 1-2 VIEWS

[AP]
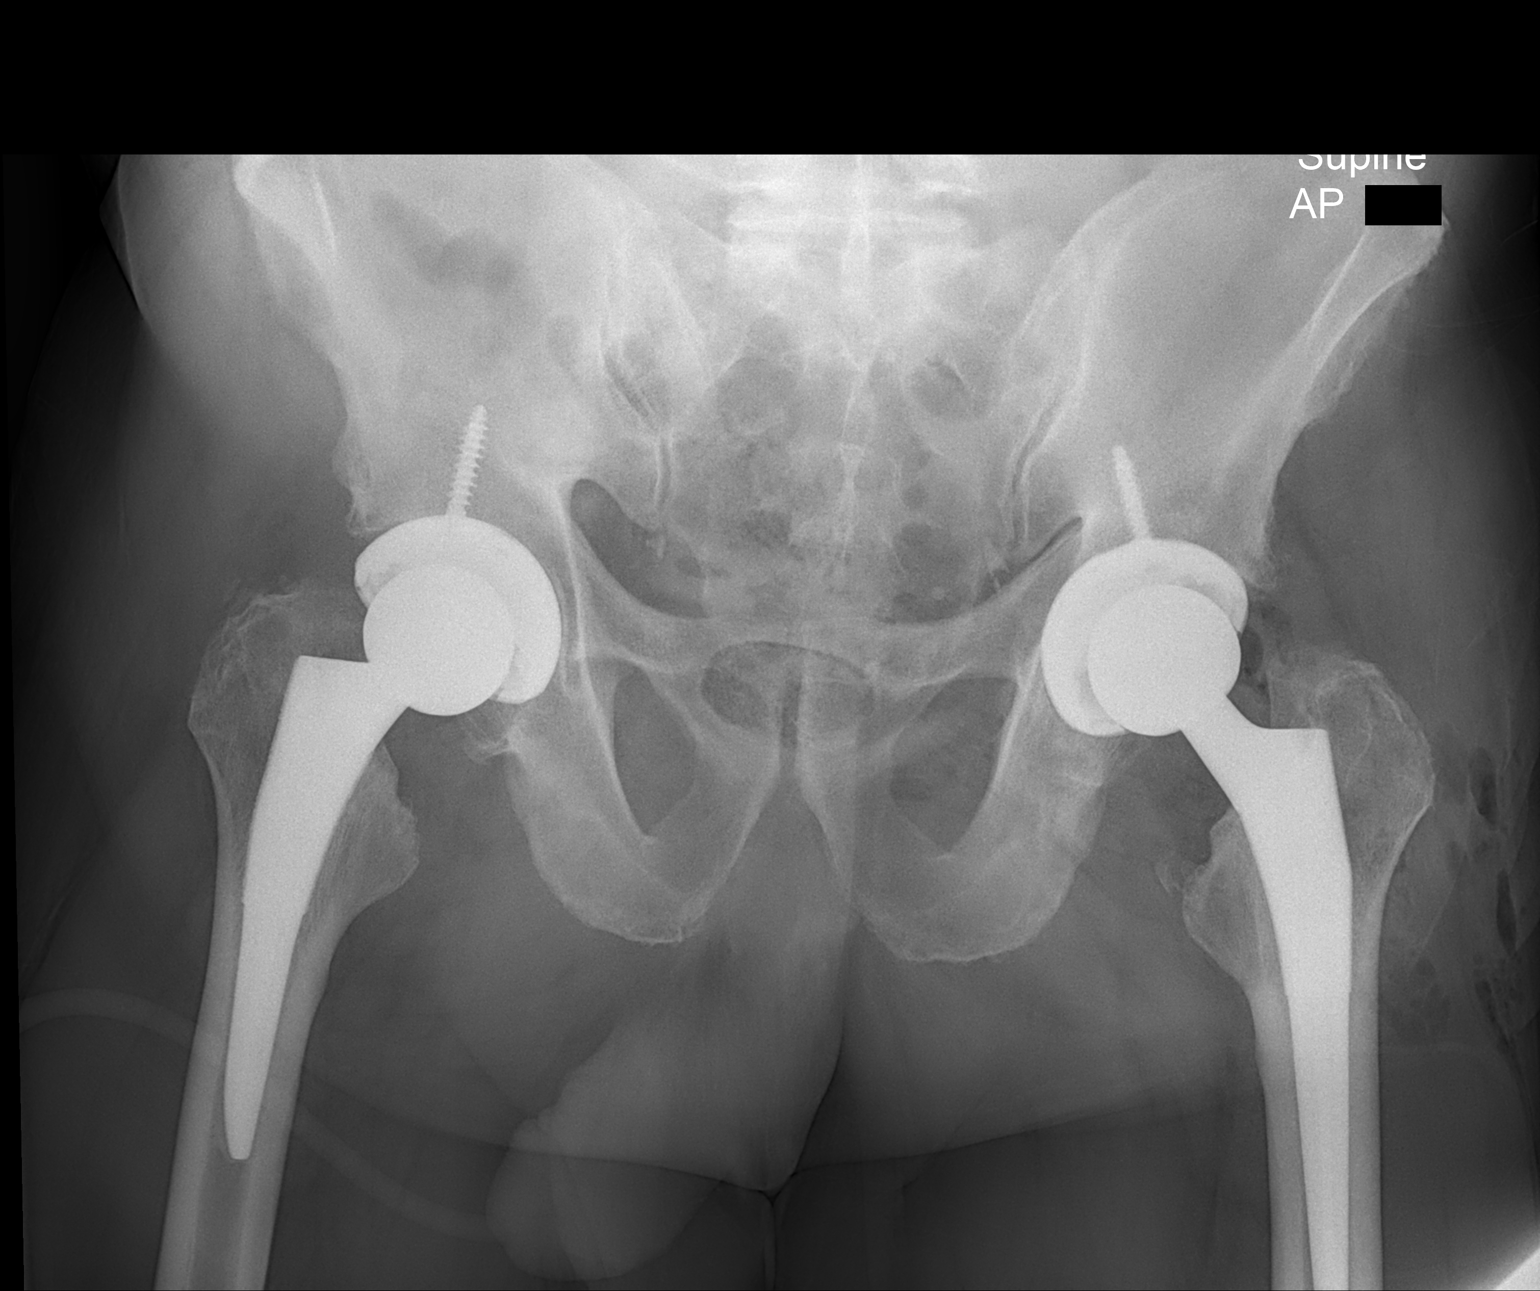

[1 of 1 positions shown; findings below may reference images not displayed]

FINDINGS: There is no evidence of pelvic fracture or diastasis. No other
pelvic bone lesions are seen. Bilateral total hip arthroplasties
appreciated. Visualized hardware appears intact without evidence of
loosening or failure. Multiple lucencies project within the soft
tissues of the left hip consistent with postsurgical changes.
IMPRESSION: No evidence of acute osseous abnormalities.

## 2014-11-03 IMAGING — CT CT ANGIO CHEST
1 of 2 series · 19 of 32 positions shown · IV contrast (OMNIPAQUE 300)
Comparison: DG CHEST 2 VIEW dated 07/07/2013; DG CHEST 2 VIEW dated
06/25/2013; CT CHEST W/O CM dated 05/25/2008

CLINICAL DATA: Left-sided chest pain with shortness of breath.
Question pulmonary embolism.

EXAM:
CT ANGIOGRAPHY CHEST WITH CONTRAST
TECHNIQUE: Multidetector CT imaging of the chest was performed using the
standard protocol during bolus administration of intravenous
contrast. Multiplanar CT image reconstructions and MIPs were
obtained to evaluate the vascular anatomy.
CONTRAST:  100mL OMNIPAQUE IOHEXOL 350 MG/ML SOLN

[Series 6: thins for pacs · axial · 0.74mm/px · z∈[+240,+442]mm · 19 of 225 slices shown]
[im 12/225  lung]
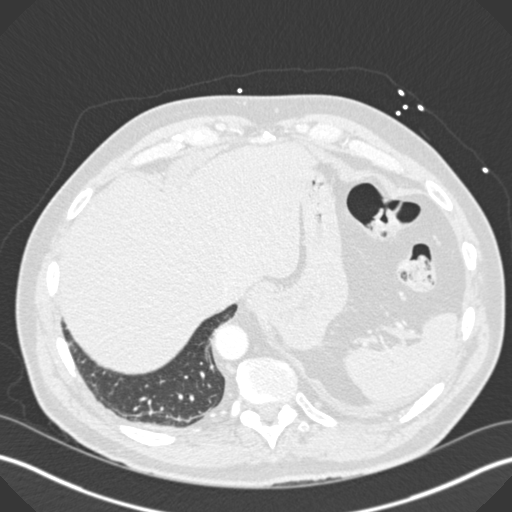
[im 23/225  mediastinal]
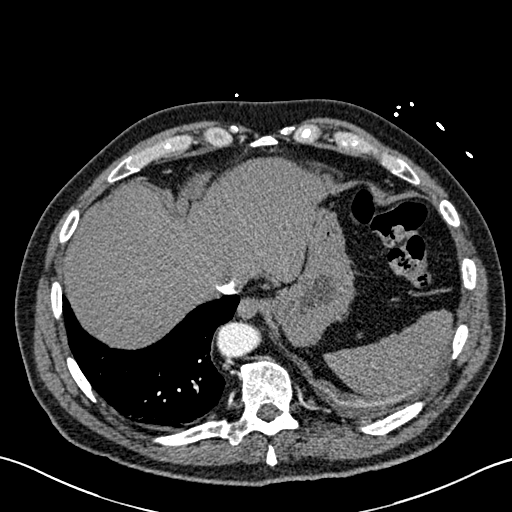
[im 34/225  lung]
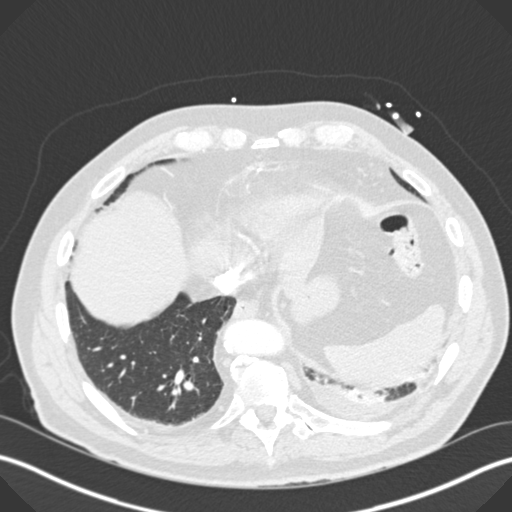
[im 57/225  mediastinal]
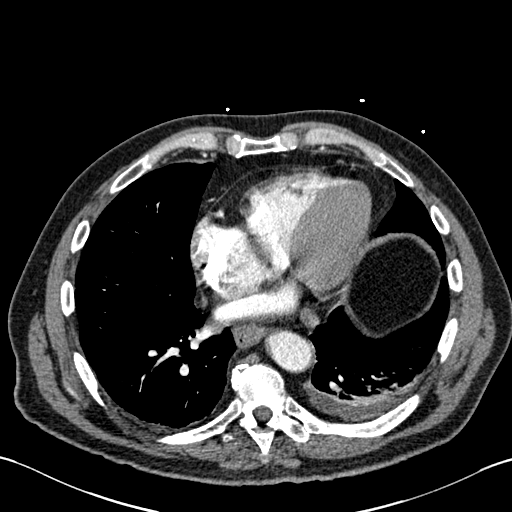
[im 68/225  lung]
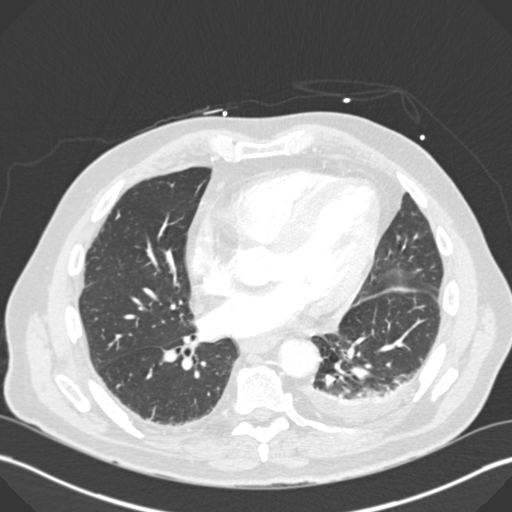
[im 75/225  mediastinal]
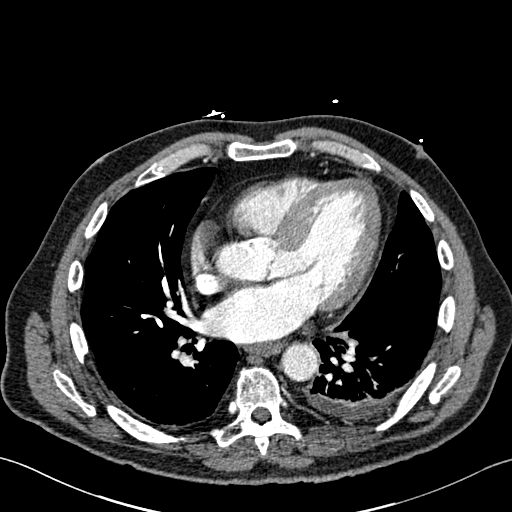
[im 79/225  lung]
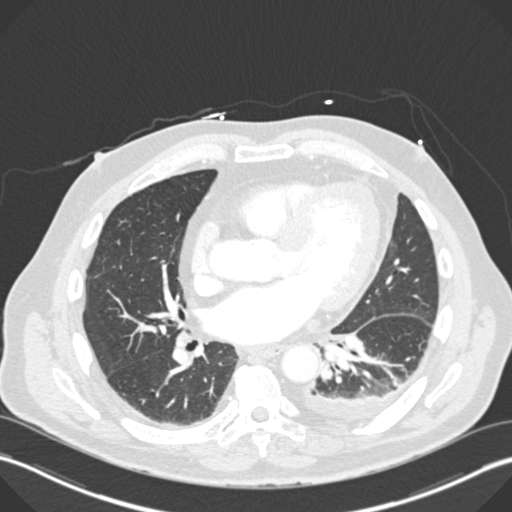
[im 90/225  mediastinal]
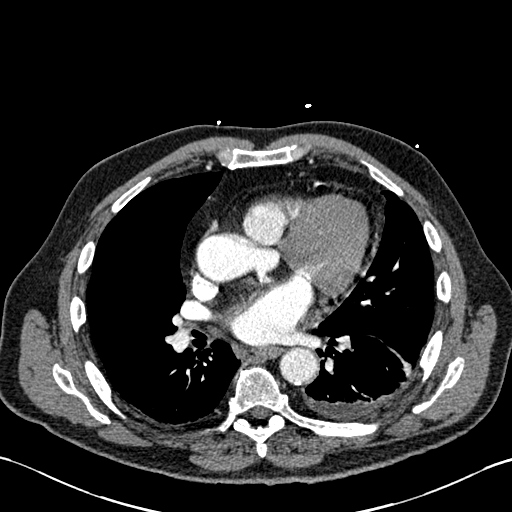
[im 101/225  lung]
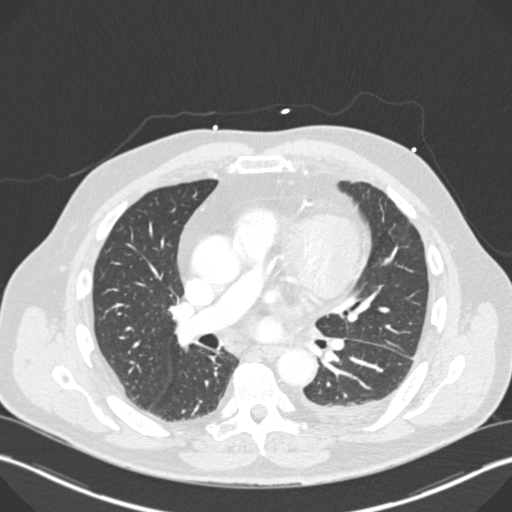
[im 113/225  mediastinal]
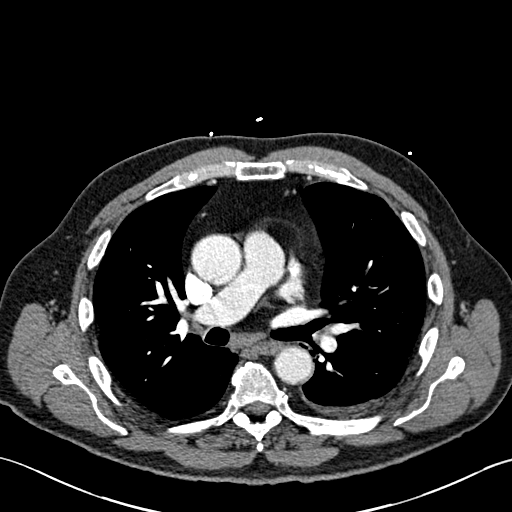
[im 124/225  lung]
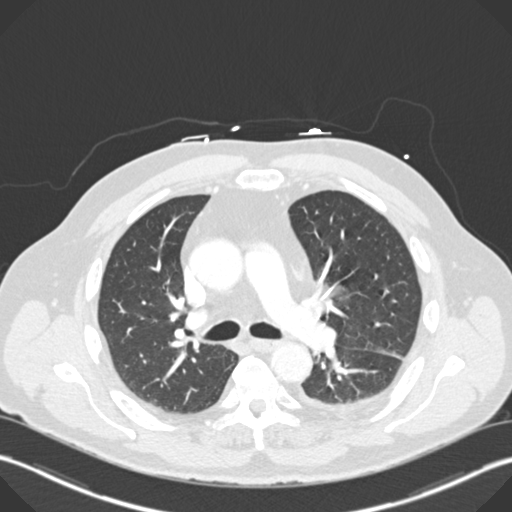
[im 135/225  mediastinal]
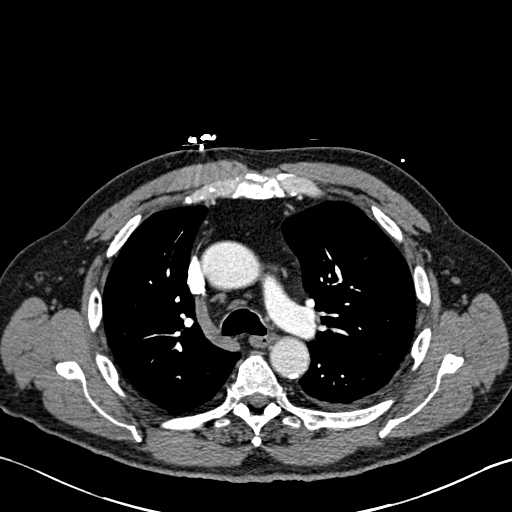
[im 146/225  lung]
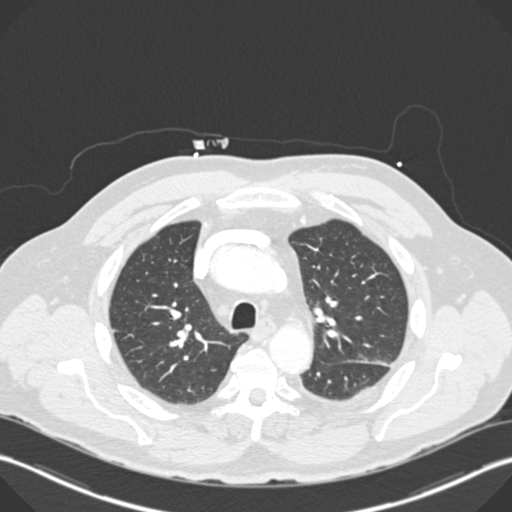
[im 150/225  mediastinal]
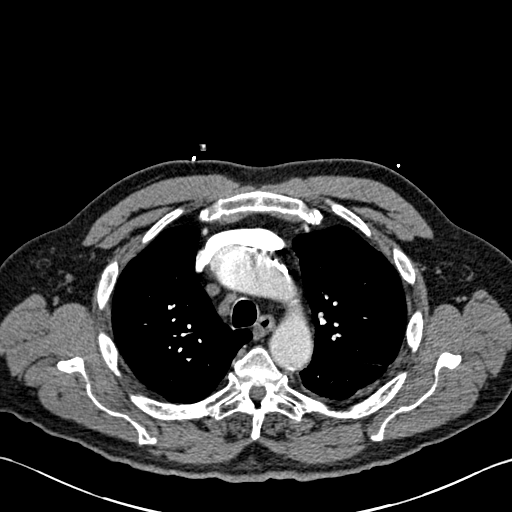
[im 157/225  lung]
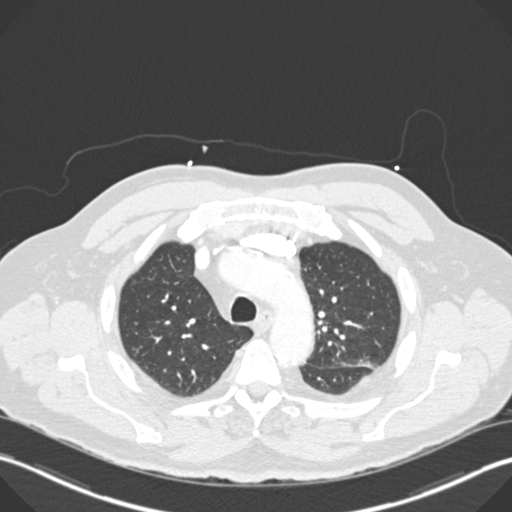
[im 169/225  mediastinal]
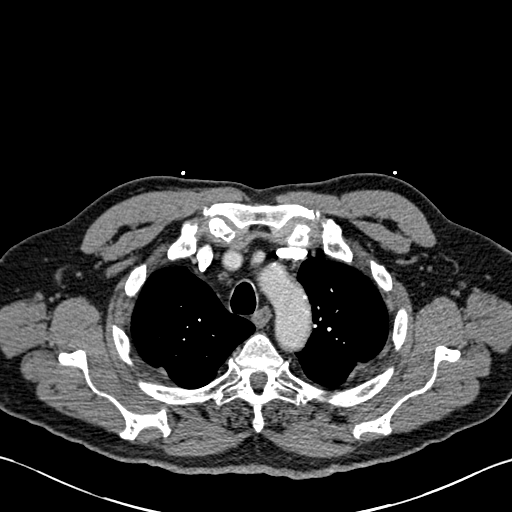
[im 191/225  lung]
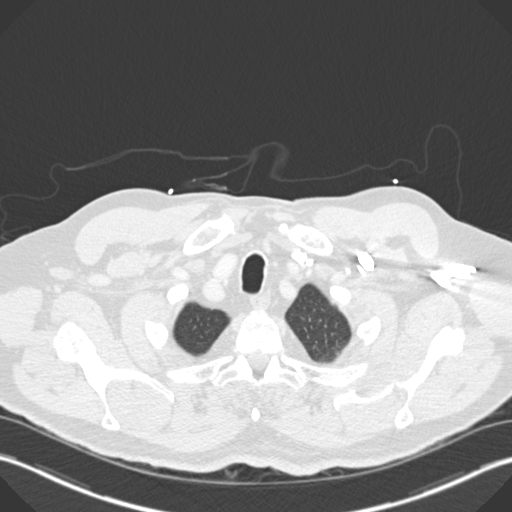
[im 202/225  mediastinal]
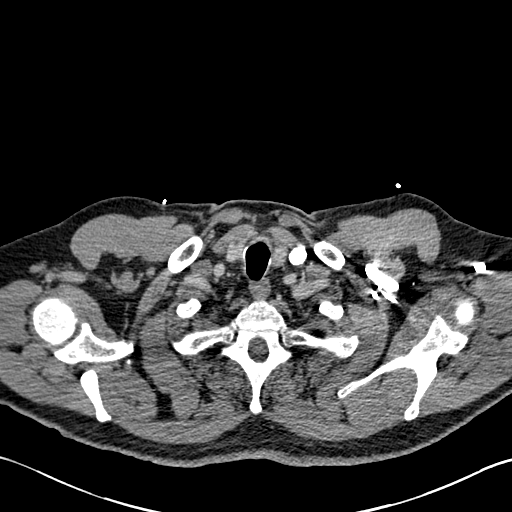
[im 213/225  lung]
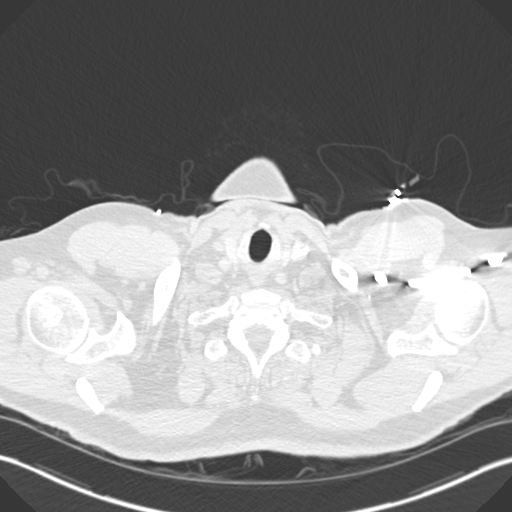

[19 of 32 positions shown; findings below may reference images not displayed]

FINDINGS: The pulmonary arteries are well opacified with contrast. There is no
evidence of acute pulmonary embolism. Atherosclerosis of the aorta,
coronary arteries and great vessels is noted. The heart size is
stable. There are scattered small mediastinal lymph nodes which are
unchanged.

The previously demonstrated right pleural effusion has resolved.
There is a small dependent left pleural effusion. There is no
pericardial effusion. Mild dependent left lower lobe atelectasis is
similar to the prior study. The right lung base is now clear. No
airspace disease or endobronchial lesion is demonstrated. There is
stable mild emphysema.

The visualized upper abdomen has a stable appearance. There are no
worrisome osseous findings.

Review of the MIP images confirms the above findings.
IMPRESSION: Button

1. No evidence of acute pulmonary embolism.
2. Interval resolution of right pleural effusion and right basilar
airspace disease. There is a small left pleural effusion with
adjacent left lower lobe atelectasis.
3. Diffuse atherosclerosis.

## 2015-02-07 ENCOUNTER — Other Ambulatory Visit: Payer: Self-pay | Admitting: Internal Medicine

## 2015-03-31 ENCOUNTER — Telehealth: Payer: Self-pay | Admitting: Family Medicine

## 2015-03-31 NOTE — Telephone Encounter (Signed)
lmom for pt to call and reschedule appt that he had on 1/25-17 with Merla Richesoolittle to 06-03-13

## 2015-04-05 ENCOUNTER — Encounter: Payer: Self-pay | Admitting: Internal Medicine

## 2015-05-03 ENCOUNTER — Other Ambulatory Visit: Payer: Self-pay | Admitting: Internal Medicine

## 2015-06-02 ENCOUNTER — Ambulatory Visit: Payer: PRIVATE HEALTH INSURANCE | Admitting: Internal Medicine

## 2015-06-04 ENCOUNTER — Ambulatory Visit (INDEPENDENT_AMBULATORY_CARE_PROVIDER_SITE_OTHER): Payer: Medicare Other | Admitting: Internal Medicine

## 2015-06-04 ENCOUNTER — Encounter: Payer: Self-pay | Admitting: Internal Medicine

## 2015-06-04 VITALS — BP 129/84 | HR 71 | Temp 98.0°F | Resp 16 | Ht 66.0 in | Wt 192.0 lb

## 2015-06-04 DIAGNOSIS — N179 Acute kidney failure, unspecified: Secondary | ICD-10-CM

## 2015-06-04 DIAGNOSIS — E785 Hyperlipidemia, unspecified: Secondary | ICD-10-CM

## 2015-06-04 DIAGNOSIS — Z23 Encounter for immunization: Secondary | ICD-10-CM | POA: Diagnosis not present

## 2015-06-04 DIAGNOSIS — R39198 Other difficulties with micturition: Secondary | ICD-10-CM

## 2015-06-04 DIAGNOSIS — R351 Nocturia: Secondary | ICD-10-CM

## 2015-06-04 DIAGNOSIS — R7989 Other specified abnormal findings of blood chemistry: Secondary | ICD-10-CM

## 2015-06-04 DIAGNOSIS — R945 Abnormal results of liver function studies: Secondary | ICD-10-CM

## 2015-06-04 DIAGNOSIS — I10 Essential (primary) hypertension: Secondary | ICD-10-CM | POA: Diagnosis not present

## 2015-06-04 DIAGNOSIS — Z6831 Body mass index (BMI) 31.0-31.9, adult: Secondary | ICD-10-CM

## 2015-06-04 DIAGNOSIS — R739 Hyperglycemia, unspecified: Secondary | ICD-10-CM | POA: Diagnosis not present

## 2015-06-04 DIAGNOSIS — H9313 Tinnitus, bilateral: Secondary | ICD-10-CM

## 2015-06-04 LAB — LIPID PANEL
Cholesterol: 219 mg/dL — ABNORMAL HIGH (ref 125–200)
HDL: 41 mg/dL (ref >=40)
LDL CALC: 114 mg/dL (ref <130)
Total CHOL/HDL Ratio: 5.3 Ratio — ABNORMAL HIGH (ref <=5.0)
Triglycerides: 318 mg/dL — ABNORMAL HIGH (ref <150)
VLDL: 64 mg/dL — ABNORMAL HIGH (ref <30)

## 2015-06-04 LAB — CBC WITH DIFFERENTIAL/PLATELET
BASOS PCT: 0 % (ref 0–1)
Basophils Absolute: 0 10*3/uL (ref 0.0–0.1)
EOS PCT: 3 % (ref 0–5)
Eosinophils Absolute: 0.2 10*3/uL (ref 0.0–0.7)
HCT: 45.6 % (ref 39.0–52.0)
Hemoglobin: 15.4 g/dL (ref 13.0–17.0)
Lymphocytes Relative: 42 % (ref 12–46)
Lymphs Abs: 2.8 10*3/uL (ref 0.7–4.0)
MCH: 30.7 pg (ref 26.0–34.0)
MCHC: 33.8 g/dL (ref 30.0–36.0)
MCV: 91 fL (ref 78.0–100.0)
MONO ABS: 0.9 10*3/uL (ref 0.1–1.0)
MPV: 9.6 fL (ref 8.6–12.4)
Monocytes Relative: 14 % — ABNORMAL HIGH (ref 3–12)
Neutro Abs: 2.7 10*3/uL (ref 1.7–7.7)
Neutrophils Relative %: 41 % — ABNORMAL LOW (ref 43–77)
Platelets: 214 10*3/uL (ref 150–400)
RBC: 5.01 MIL/uL (ref 4.22–5.81)
RDW: 14.8 % (ref 11.5–15.5)
WBC: 6.6 10*3/uL (ref 4.0–10.5)

## 2015-06-04 LAB — COMPREHENSIVE METABOLIC PANEL
ALK PHOS: 63 U/L (ref 40–115)
ALT: 49 U/L — ABNORMAL HIGH (ref 9–46)
AST: 43 U/L — AB (ref 10–35)
Albumin: 4.8 g/dL (ref 3.6–5.1)
BILIRUBIN TOTAL: 1.4 mg/dL — AB (ref 0.2–1.2)
BUN: 21 mg/dL (ref 7–25)
CHLORIDE: 103 mmol/L (ref 98–110)
CO2: 25 mmol/L (ref 20–31)
Calcium: 9.8 mg/dL (ref 8.6–10.3)
Creat: 1.17 mg/dL (ref 0.70–1.18)
GLUCOSE: 132 mg/dL — AB (ref 65–99)
Potassium: 4.4 mmol/L (ref 3.5–5.3)
SODIUM: 138 mmol/L (ref 135–146)
Total Protein: 6.9 g/dL (ref 6.1–8.1)

## 2015-06-04 LAB — POCT GLYCOSYLATED HEMOGLOBIN (HGB A1C): HEMOGLOBIN A1C: 6.8

## 2015-06-04 MED ORDER — LISINOPRIL-HYDROCHLOROTHIAZIDE 20-12.5 MG PO TABS: ORAL_TABLET | ORAL | Status: AC

## 2015-06-04 MED ORDER — AMLODIPINE BESYLATE 5 MG PO TABS: ORAL_TABLET | ORAL | Status: AC

## 2015-06-04 MED ORDER — SIMVASTATIN 20 MG PO TABS: ORAL_TABLET | ORAL | Status: DC

## 2015-06-04 NOTE — Progress Notes (Signed)
   Subjective:    Patient ID: Steven Paul, male    DOB: October 15, 1940, 75 y.o.   MRN: [id number]  HPI Testing 123 looks like it's com overused it all weekend by  Review of Systems     Objective:   Physical Exam        Assessment & Plan:

## 2015-06-04 NOTE — Progress Notes (Addendum)
Subjective:    Patient ID: Steven Paul, male    DOB: 1940/05/22, 75 y.o.   MRN: [id number] By signing my name below, I, Zola Button, attest that this documentation has been prepared under the direction and in the presence of Tami Lin, MD.  Electronically Signed: Zola Button, Medical Scribe. 06/04/2015. 11:33 AM.  HPI HPI Comments: Steven Paul is a 75 y.o. male who presents to the [facility] for a follow-up. Patient Active Problem List   Diagnosis Date Noted  . Abnormal LFTs - needs follow-up, has been asymptomatic 06/03/2014  . AKI (acute kidney injury) ([place]) -in setting of admit for CAP= resolved 2015 07/07/2013  . S/P left hip revision - good function 06/30/2013  . Overweight (BMI 25.0-29.9) - no change in weight over the last year 02/07/2012  . S/P right THA, AA - stable 02/06/2012  . HTN (hypertension) - outside blood pressures good 08/02/2011  . Hyperlipemia - continues on Zocor 08/02/2011  . 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  HENT: Positive for hearing loss and tinnitus (slowly progressive over yrs). Negative for trouble swallowing.   Eyes: Negative for photophobia and visual disturbance.  Respiratory: Negative for cough and shortness of breath.   Cardiovascular: Negative for chest pain, palpitations and leg swelling.  Gastrointestinal: Negative for abdominal pain.  Genitourinary: Negative for difficulty urinating.       Except nocturia x 2  Musculoskeletal: Negative for back pain and neck pain.  Neurological: Negative for headaches.  Hematological: Does not bruise/bleed easily.  Psychiatric/Behavioral: Negative for behavioral  problems and sleep disturbance.       Objective:   Physical Exam  Constitutional: He is oriented to person, place, and time. He appears well-developed and well-nourished. No distress.  HENT:  Head: Normocephalic and atraumatic.  Mouth/Throat: Oropharynx is clear and moist.  Eyes: EOM are normal. Pupils are equal, round, and reactive to light.  Neck: Neck supple.  Cardiovascular: Normal rate, regular rhythm, normal heart sounds and intact distal pulses.   No murmur heard. Pulmonary/Chest: Effort normal and breath sounds normal.  Musculoskeletal: He exhibits no edema.  Neurological: He is alert and oriented to person, place, and time. No cranial nerve deficit.  Skin: Skin is warm and dry. No rash noted.  Psychiatric: He has a normal mood and affect. His behavior is normal. Judgment and thought content normal.  Nursing note and vitals reviewed. BP 129/84 mmHg  Pulse 71  Temp(Src) 98 F (36.7 C)  Resp 16  Ht '5\' 6"'$  (1.676 m)  Wt 192 lb (87.091 kg)  BMI 31.00 kg/m2 Wt Readings from Last 3 Encounters:  06/04/15 192 lb (87.091 kg)  06/03/14 197 lb (89.359 kg)  12/03/13 196 lb (88.905 kg)          Assessment & Plan:  I have completed the patient encounter in its entirety as documented by the scribe, with editing by me where necessary. Dekisha Mesmer P. Laney Pastor, M.D.  Essential hypertension -stable  Hyperlipemia - needs labs at zocor 40  Hyperglycemia - Plan: POCT glycosylated hemoglobin (Hb A1C)  Overweight (BMI 25.0-29.9 now up to 31))---he loves to eat and is  having a hard time changing.  Abnormal LFTs in past-will reck---hep c neg  Subjective change in urination--(very likely not a problem with GU) Nocturia - Plan: PSA  Tinnitus, bilateral--[facility]for >[facility]yr Meds ordered this encounter  Medications  . amLODipine (NORVASC) 5 MG tablet    Sig: TAKE 1 TABLET BY MOUTH ONCE DAILY    Dispense:  90 tablet    Refill:  3  .  lisinopril-hydrochlorothiazide (PRINZIDE,ZESTORETIC) 20-12.5 MG tablet    Sig: TAKE 1 TABLET BY MOUTH EVERY MORNING    Dispense:  90 tablet    Refill:  3  . simvastatin (ZOCOR) 20 MG tablet    Sig: TAKE 1 TABLET BY MOUTH AT BEDTIME    Dispense:  90 tablet    Refill:  3     Ref Dr. SAnsel Bongfor new PCP in 643mo  Addend 1/28 labs Results for orders placed or performed in visit on 06/04/15  CBC with Differential/Platelet  Result Value Ref Range   WBC 6.6 4.0 - 10.5 K/uL   RBC 5.01 4.22 - 5.81 MIL/uL   Hemoglobin 15.4 13.0 - 17.0 g/dL   HCT 45.6 39.0 - 52.0 %   MCV 91.0 78.0 - 100.0 fL   MCH 30.7 26.0 - 34.0 pg   MCHC 33.8 30.0 - 36.0 g/dL   RDW 14.8 11.5 - 15.5 %   Platelets 214 150 - 400 K/uL   MPV 9.6 8.6 - 12.4 fL   Neutrophils Relative % 41 (L) 43 - 77 %   Neutro Abs 2.7 1.7 - 7.7 K/uL   Lymphocytes Relative 42 12 - 46 %   Lymphs Abs 2.8 0.7 - 4.0 K/uL   Monocytes Relative 14 (H) 3 - 12 %   Monocytes Absolute 0.9 0.1 - 1.0 K/uL   Eosinophils Relative 3 0 - 5 %   Eosinophils Absolute 0.2 0.0 - 0.7 K/uL   Basophils Relative 0 0 - 1 %   Basophils Absolute 0.0 0.0 - 0.1 K/uL   Smear Review Criteria for review not met   Comprehensive metabolic panel  Result Value Ref Range   Sodium 138 135 - 146 mmol/L   Potassium 4.4 3.5 - 5.3 mmol/L   Chloride 103 98 - 110 mmol/L   CO2 25 20 - 31 mmol/L   Glucose, Bld 132 (H) 65 - 99 mg/dL   BUN 21 7 - 25 mg/dL   Creat 1.17 0.70 - 1.18 mg/dL   Total Bilirubin 1.4 (H) 0.2 - 1.2 mg/dL   Alkaline Phosphatase 63 40 - 115 U/L   AST 43 (H)-same x [id number] hepC 10 - 35 U/L   ALT 49 (H) 9 - 46 U/L   Total Protein 6.9 6.1 - 8.1 g/dL   Albumin 4.8 3.6 - 5.1 g/dL   Calcium 9.8 8.6 - 10.3 mg/dL  Lipid panel  Result Value Ref Range   Cholesterol 219 (H) 125 - 200 mg/dL   Triglycerides 318 (H) <150 mg/dL   HDL 41 >=40 mg/dL   Total CHOL/HDL Ratio 5.3 (H) <=5.0 Ratio   VLDL 64 (H) <30 mg/dL   LDL Cholesterol 114 <130 mg/dL  PSA  Result  Value Ref Range   PSA 0.57 <=4.00 ng/mL  POCT glycosylated hemoglobin (Hb A1C)  Result Value Ref Range   Hemoglobin A1C 6.8 Sl increase  i'll ask for 15-20lbs weight loss increase zocor to 40 Needs RUQ [place] Meets criteria now for metabolic syndrome

## 2015-06-05 DIAGNOSIS — H9319 Tinnitus, unspecified ear: Secondary | ICD-10-CM | POA: Insufficient documentation

## 2015-06-05 LAB — PSA: PSA: 0.57 ng/mL (ref <=4.00)

## 2015-06-05 MED ORDER — SIMVASTATIN 40 MG PO TABS: ORAL_TABLET | ORAL | Status: AC

## 2015-06-09 ENCOUNTER — Telehealth: Payer: Self-pay | Admitting: Internal Medicine

## 2015-06-09 NOTE — Telephone Encounter (Signed)
-----   Message from Lorenza Evangelist, [place] sent at 06/08/2015 10:53 AM EST ----- See below Elnita Maxwell. ----- Message -----    From: Shelva Majestic, MD    Sent: 06/07/2015   7:58 AM      To: Tonye Pearson, MD, Percival Spanish, #  Dr. Merla Riches,  Congratulations on retirement. You will certainly be missed.   Elnita Maxwell- can you set patient up with appointment within 6 months. Can be 8 15, 8 45, 1 15 or 1 45 anyday of the week- not just Monday.   Thanks, Tana Conch  ----- Message -----    From: Tonye Pearson, MD    Sent: 06/05/2015   9:03 PM      To: Shelva Majestic, MD  Hey--I retire in May!!! And i want you to take over care of this terrific patient if you are willing--I asked the staff to schedule him in 6 mos---I promise I won't send you anyone you won't really enjoy

## 2015-06-15 ENCOUNTER — Telehealth: Payer: Self-pay

## 2015-06-15 NOTE — Telephone Encounter (Signed)
Pt is wanting to know if dr Merla Riches has any more suggestions about a physician he states that dr hunter can not see patient til 2018  Best number [phone]

## 2015-06-21 NOTE — Telephone Encounter (Signed)
Call him---Dr Durene Cal should be worth waiting for --I say take the appt and we'll get you thru til then

## 2015-06-22 NOTE — Telephone Encounter (Signed)
Spoke with patient - he will make the appt and continue f/u here until then.

## 2016-06-19 ENCOUNTER — Ambulatory Visit: Payer: Medicare Other | Admitting: Family Medicine

## 2023-04-04 DIAGNOSIS — E782 Mixed hyperlipidemia: Secondary | ICD-10-CM | POA: Diagnosis not present

## 2023-04-04 DIAGNOSIS — E119 Type 2 diabetes mellitus without complications: Secondary | ICD-10-CM | POA: Diagnosis not present
